# Patient Record
Sex: Female | Born: 1960 | Race: Black or African American | Hispanic: No | Marital: Single | Smoking: Current every day smoker
Health system: Southern US, Community
[De-identification: ages and names within clinical notes are randomized; demographics above are authoritative.]

## PROBLEM LIST (undated history)

## (undated) DIAGNOSIS — F419 Anxiety disorder, unspecified: Secondary | ICD-10-CM

## (undated) DIAGNOSIS — J40 Bronchitis, not specified as acute or chronic: Secondary | ICD-10-CM

## (undated) DIAGNOSIS — K219 Gastro-esophageal reflux disease without esophagitis: Secondary | ICD-10-CM

## (undated) DIAGNOSIS — I10 Essential (primary) hypertension: Secondary | ICD-10-CM

---

## 2014-12-28 ENCOUNTER — Emergency Department
Admission: EM | Admit: 2014-12-28 | Discharge: 2014-12-28 | Disposition: A | Discharge status: Home / Self Care | Payer: Self-pay | Attending: Emergency Medicine | Admitting: Emergency Medicine

## 2014-12-28 ENCOUNTER — Encounter: Payer: Self-pay | Admitting: Emergency Medicine

## 2014-12-28 ENCOUNTER — Emergency Department: Payer: Self-pay

## 2014-12-28 DIAGNOSIS — G43009 Migraine without aura, not intractable, without status migrainosus: Secondary | ICD-10-CM | POA: Insufficient documentation

## 2014-12-28 DIAGNOSIS — F419 Anxiety disorder, unspecified: Secondary | ICD-10-CM | POA: Insufficient documentation

## 2014-12-28 DIAGNOSIS — I10 Essential (primary) hypertension: Secondary | ICD-10-CM | POA: Insufficient documentation

## 2014-12-28 DIAGNOSIS — G43909 Migraine, unspecified, not intractable, without status migrainosus: Secondary | ICD-10-CM

## 2014-12-28 DIAGNOSIS — Z720 Tobacco use: Secondary | ICD-10-CM | POA: Insufficient documentation

## 2014-12-28 DIAGNOSIS — Z79899 Other long term (current) drug therapy: Secondary | ICD-10-CM | POA: Insufficient documentation

## 2014-12-28 HISTORY — DX: Essential (primary) hypertension: I10

## 2014-12-28 HISTORY — DX: Gastro-esophageal reflux disease without esophagitis: K21.9

## 2014-12-28 HISTORY — DX: Bronchitis, not specified as acute or chronic: J40

## 2014-12-28 LAB — CBC
HEMATOCRIT: 40.6 % (ref 35.0–47.0)
HEMOGLOBIN: 13.6 g/dL (ref 12.0–16.0)
MCH: 33.1 pg (ref 26.0–34.0)
MCHC: 33.6 g/dL (ref 32.0–36.0)
MCV: 98.6 fL (ref 80.0–100.0)
Platelets: 164 10*3/uL (ref 150–440)
RBC: 4.12 MIL/uL (ref 3.80–5.20)
RDW: 14.3 % (ref 11.5–14.5)
WBC: 7.1 10*3/uL (ref 3.6–11.0)

## 2014-12-28 LAB — COMPREHENSIVE METABOLIC PANEL
ALT: 66 U/L — ABNORMAL HIGH (ref 14–54)
ANION GAP: 8 (ref 5–15)
AST: 97 U/L — ABNORMAL HIGH (ref 15–41)
Albumin: 3.1 g/dL — ABNORMAL LOW (ref 3.5–5.0)
Alkaline Phosphatase: 196 U/L — ABNORMAL HIGH (ref 38–126)
BUN: 8 mg/dL (ref 6–20)
CO2: 29 mmol/L (ref 22–32)
CREATININE: 0.98 mg/dL (ref 0.44–1.00)
Calcium: 9.2 mg/dL (ref 8.9–10.3)
Chloride: 104 mmol/L (ref 101–111)
GFR calc non Af Amer: >60 mL/min (ref >60)
Glucose, Bld: 113 mg/dL — ABNORMAL HIGH (ref 65–99)
Potassium: 3.6 mmol/L (ref 3.5–5.1)
Sodium: 141 mmol/L (ref 135–145)
TOTAL PROTEIN: 7.9 g/dL (ref 6.5–8.1)
Total Bilirubin: 0.9 mg/dL (ref 0.3–1.2)

## 2014-12-28 MED ORDER — METOCLOPRAMIDE HCL 10 MG PO TABS: 10.0000 mg | ORAL_TABLET | Freq: Three times a day (TID) | ORAL | Status: DC

## 2014-12-28 MED ORDER — DIAZEPAM 5 MG PO TABS
5.0000 mg | ORAL_TABLET | Freq: Once | ORAL | Status: AC
  Administered 2014-12-28: 5 mg via ORAL

## 2014-12-28 MED ORDER — METHYLPREDNISOLONE SODIUM SUCC 125 MG IJ SOLR
125.0000 mg | Freq: Once | INTRAMUSCULAR | Status: AC
  Administered 2014-12-28: 125 mg via INTRAVENOUS

## 2014-12-28 MED ORDER — FAMOTIDINE 20 MG PO TABS
ORAL_TABLET | ORAL | Status: AC
  Administered 2014-12-28: 40 mg via ORAL
  Filled 2014-12-28: qty 2

## 2014-12-28 MED ORDER — METHYLPREDNISOLONE SODIUM SUCC 125 MG IJ SOLR
INTRAMUSCULAR | Status: AC
  Administered 2014-12-28: 125 mg via INTRAVENOUS
  Filled 2014-12-28: qty 2

## 2014-12-28 MED ORDER — METOCLOPRAMIDE HCL 5 MG/ML IJ SOLN
10.0000 mg | Freq: Once | INTRAMUSCULAR | Status: AC
  Administered 2014-12-28: 10 mg via INTRAVENOUS

## 2014-12-28 MED ORDER — FAMOTIDINE 20 MG PO TABS
40.0000 mg | ORAL_TABLET | Freq: Once | ORAL | Status: AC
  Administered 2014-12-28: 40 mg via ORAL

## 2014-12-28 MED ORDER — KETOROLAC TROMETHAMINE 30 MG/ML IJ SOLN
60.0000 mg | Freq: Once | INTRAMUSCULAR | Status: AC
  Administered 2014-12-28: 30 mg via INTRAVENOUS

## 2014-12-28 MED ORDER — DIPHENHYDRAMINE HCL 25 MG PO CAPS: 50.0000 mg | ORAL_CAPSULE | Freq: Four times a day (QID) | ORAL | Status: DC | PRN

## 2014-12-28 MED ORDER — METOCLOPRAMIDE HCL 5 MG/ML IJ SOLN
INTRAMUSCULAR | Status: AC
  Administered 2014-12-28: 10 mg via INTRAVENOUS
  Filled 2014-12-28: qty 2

## 2014-12-28 MED ORDER — DIPHENHYDRAMINE HCL 50 MG/ML IJ SOLN
INTRAMUSCULAR | Status: AC
  Administered 2014-12-28: 50 mg via INTRAVENOUS
  Filled 2014-12-28: qty 1

## 2014-12-28 MED ORDER — KETOROLAC TROMETHAMINE 60 MG/2ML IM SOLN
INTRAMUSCULAR | Status: AC
  Filled 2014-12-28: qty 2

## 2014-12-28 MED ORDER — DIPHENHYDRAMINE HCL 50 MG/ML IJ SOLN
50.0000 mg | Freq: Once | INTRAMUSCULAR | Status: AC
  Administered 2014-12-28: 50 mg via INTRAVENOUS

## 2014-12-28 MED ORDER — DIAZEPAM 5 MG PO TABS
ORAL_TABLET | ORAL | Status: AC
  Administered 2014-12-28: 5 mg via ORAL
  Filled 2014-12-28: qty 1

## 2014-12-28 NOTE — Discharge Instructions (Signed)
Recurrent Migraine Headache A migraine headache is an intense, throbbing pain on one or both sides of your head. Recurrent migraines keep coming back. A migraine can last for 30 minutes to several hours. CAUSES  The exact cause of a migraine headache is not always known. However, a migraine may be caused when nerves in the brain become irritated and release chemicals that cause inflammation. This causes pain. Certain things may also trigger migraines, such as:   Alcohol.  Smoking.  Stress.  Menstruation.  Aged cheeses.  Foods or drinks that contain nitrates, glutamate, aspartame, or tyramine.  Lack of sleep.  Chocolate.  Caffeine.  Hunger.  Physical exertion.  Fatigue.  Medicines used to treat chest pain (nitroglycerine), birth control pills, estrogen, and some blood pressure medicines. SYMPTOMS   Pain on one or both sides of your head.  Pulsating or throbbing pain.  Severe pain that prevents daily activities.  Pain that is aggravated by any physical activity.  Nausea, vomiting, or both.  Dizziness.  Pain with exposure to bright lights, loud noises, or activity.  General sensitivity to bright lights, loud noises, or smells. Before you get a migraine, you may get warning signs that a migraine is coming (aura). An aura may include:  Seeing flashing lights.  Seeing bright spots, halos, or zigzag lines.  Having tunnel vision or blurred vision.  Having feelings of numbness or tingling.  Having trouble talking.  Having muscle weakness. DIAGNOSIS  A recurrent migraine headache is often diagnosed based on:  Symptoms.  Physical examination.  A CT scan or MRI of your head. These imaging tests cannot diagnose migraines but can help rule out other causes of headaches.  TREATMENT  Medicines may be given for pain and nausea. Medicines can also be given to help prevent recurrent migraines. HOME CARE INSTRUCTIONS  Only take over-the-counter or prescription  medicines for pain or discomfort as directed by your health care provider. The use of long-term narcotics is not recommended.  Lie down in a dark, quiet room when you have a migraine.  Keep a journal to find out what may trigger your migraine headaches. For example, write down:  What you eat and drink.  How much sleep you get.  Any change to your diet or medicines.  Limit alcohol consumption.  Quit smoking if you smoke.  Get 7-9 hours of sleep, or as recommended by your health care provider.  Limit stress.  Keep lights dim if bright lights bother you and make your migraines worse. SEEK MEDICAL CARE IF:   You do not get relief from the medicines given to you.  You have a recurrence of pain.  You have a fever. SEEK IMMEDIATE MEDICAL CARE IF:  Your migraine becomes severe.  You have a stiff neck.  You have loss of vision.  You have muscular weakness or loss of muscle control.  You start losing your balance or have trouble walking.  You feel faint or pass out.  You have severe symptoms that are different from your first symptoms. MAKE SURE YOU:   Understand these instructions.  Will watch your condition.  Will get help right away if you are not doing well or get worse. Document Released: 04/25/2001 Document Revised: 12/15/2013 Document Reviewed: 04/07/2013 [facility] Patient Information 2015 [place], [place]. This information is not intended to replace advice given to you by your health care provider. Make sure you discuss any questions you have with your health care provider.  Panic Attacks Panic attacks are sudden, short-livedsurges of  severe anxiety, fear, or discomfort. They may occur for no reason when you are relaxed, when you are anxious, or when you are sleeping. Panic attacks may occur for a number of reasons:   Healthy people occasionally have panic attacks in extreme, life-threatening situations, such as war or natural disasters. Normal anxiety is a  protective mechanism of the body that helps [place] react to danger (fight or flight response).  Panic attacks are often seen with anxiety disorders, such as panic disorder, social anxiety disorder, generalized anxiety disorder, and phobias. Anxiety disorders cause excessive or uncontrollable anxiety. They may interfere with your relationships or other life activities.  Panic attacks are sometimes seen with other mental illnesses, such as depression and posttraumatic stress disorder.  Certain medical conditions, prescription medicines, and drugs of abuse can cause panic attacks. SYMPTOMS  Panic attacks start suddenly, peak within 20 minutes, and are accompanied by four or more of the following symptoms:  Pounding heart or fast heart rate (palpitations).  Sweating.  Trembling or shaking.  Shortness of breath or feeling smothered.  Feeling choked.  Chest pain or discomfort.  Nausea or strange feeling in your stomach.  Dizziness, light-headedness, or feeling like you will faint.  Chills or hot flushes.  Numbness or tingling in your lips or hands and feet.  Feeling that things are not real or feeling that you are not yourself.  Fear of losing control or going crazy.  Fear of dying. Some of these symptoms can mimic serious medical conditions. For example, you may think you are having a heart attack. Although panic attacks can be very scary, they are not life threatening. DIAGNOSIS  Panic attacks are diagnosed through an assessment by your health care provider. Your health care provider will ask questions about your symptoms, such as where and when they occurred. Your health care provider will also ask about your medical history and use of alcohol and drugs, including prescription medicines. Your health care provider may order blood tests or other studies to rule out a serious medical condition. Your health care provider may refer you to a mental health professional for further  evaluation. TREATMENT   Most healthy people who have one or two panic attacks in an extreme, life-threatening situation will not require treatment.  The treatment for panic attacks associated with anxiety disorders or other mental illness typically involves counseling with a mental health professional, medicine, or a combination of both. Your health care provider will help determine what treatment is best for you.  Panic attacks due to physical illness usually go away with treatment of the illness. If prescription medicine is causing panic attacks, talk with your health care provider about stopping the medicine, decreasing the dose, or substituting another medicine.  Panic attacks due to alcohol or drug abuse go away with abstinence. Some adults need professional help in order to stop drinking or using drugs. HOME CARE INSTRUCTIONS   Take all medicines as directed by your health care provider.   Schedule and attend follow-up visits as directed by your health care provider. It is important to keep all your appointments. SEEK MEDICAL CARE IF:  You are not able to take your medicines as prescribed.  Your symptoms do not improve or get worse. SEEK IMMEDIATE MEDICAL CARE IF:   You experience panic attack symptoms that are different than your usual symptoms.  You have serious thoughts about hurting yourself or others.  You are taking medicine for panic attacks and have a serious side effect. MAKE SURE  YOU:  Understand these instructions.  Will watch your condition.  Will get help right away if you are not doing well or get worse. Document Released: 07/31/2005 Document Revised: 08/05/2013 Document Reviewed: 03/14/2013 [facility] Patient Information 2015 [place], [place]. This information is not intended to replace advice given to you by your health care provider. Make sure you discuss any questions you have with your health care provider.

## 2014-12-28 NOTE — ED Notes (Signed)
Pt to ed with c/o headache that started yesterday and shortness of breath that started last night.  Hx of headaches and HTN

## 2014-12-28 NOTE — ED Provider Notes (Addendum)
[facility] Emergency Department Provider Note  ____________________________________________  Time seen: 4:40 PM  I have reviewed the triage vital signs and the nursing notes.   HISTORY  Chief Complaint Shortness of Breath    HPI Caroline Smith is a 54 y.o. female who reports a severe occipital headache on both sides for the past 2-3 days. She also reports history of hypertension and has been compliant with her medications. She has had multiple similar headaches in the past. Including 3 weeks ago and 2 months ago. She has not seen a neurologist for this. She also reports that she is feeling very stressed and anxious because she was moving to [facility], but would rather live in [place] despite pressures from her family.The pain is sharp, throbbing, nonradiating. No aggravating or alleviating factors. She does not have any associated numbness, tingling or weakness or vision changes or nausea, vomiting, fever, chills, chest pain or shortness of breath. She does report photophobia and phonophobia with the headache.    Past Medical History  Diagnosis Date  . Hypertension   . Bronchitis   . GERD (gastroesophageal reflux disease)     There are no active problems to display for this patient.   History reviewed. No pertinent past surgical history.  Current Outpatient Rx  Name  Route  Sig  Dispense  Refill  . diphenhydrAMINE (BENADRYL) 25 mg capsule   Oral   Take 2 capsules (50 mg total) by mouth every 6 (six) hours as needed.   60 capsule   0   . metoCLOPramide (REGLAN) 10 MG tablet   Oral   Take 1 tablet (10 mg total) by mouth 4 (four) times daily -  before meals and at bedtime.   60 tablet   0     Allergies Tramadol  No family history on file.  Social History History  Substance Use Topics  . Smoking status: Current Every Day Smoker  . Smokeless tobacco: Not on file  . Alcohol Use: No    Review of Systems  Constitutional: No  fever or chills. No weight changes Eyes:No blurry vision or double vision.  ENT: No sore throat. Cardiovascular: No chest pain. Respiratory: Chronic shortness of breath due to running out of albuterol and Qvar. Gastrointestinal: Negative for abdominal pain, vomiting and diarrhea.  No BRBPR or melena. Genitourinary: Negative for dysuria, urinary retention, bloody urine, or difficulty urinating. Musculoskeletal: Negative for back pain. No joint swelling or pain. Skin: Negative for rash. Neurological: Negative for headaches, focal weakness or numbness. Psychiatric:No anxiety or depression.   Endocrine:No hot/cold intolerance, changes in energy, or sleep difficulty.  10-point ROS otherwise negative.  ____________________________________________   PHYSICAL EXAM:  VITAL SIGNS: ED Triage Vitals  Enc Vitals Group     BP 12/28/14 1426 173/95 mmHg     Pulse Rate 12/28/14 1426 70     Resp 12/28/14 1426 18     Temp 12/28/14 1426 97.6 F (36.4 C)     Temp Source 12/28/14 1426 Oral     SpO2 12/28/14 1426 98 %     Weight 12/28/14 1426 118 lb (53.524 kg)     Height 12/28/14 1426  (1.702 m)     Head Cir --      Peak Flow --      Pain Score 12/28/14 1427 10     Pain Loc --      Pain Edu? --      Excl. in GC? --  Constitutional: Alert and oriented. Well appearing and in no distress. Anxious and tearful Eyes: No scleral icterus. No conjunctival pallor. PERRL. EOMI ENT   Head: Normocephalic and atraumatic.   Nose: No congestion/rhinnorhea. No septal hematoma   Mouth/Throat: MMM, no pharyngeal erythema. No peritonsillar mass. No uvula shift.   Neck: No stridor. No SubQ emphysema. No meningismus. Hematological/Lymphatic/Immunilogical: No cervical lymphadenopathy. Cardiovascular: RRR. Normal and symmetric distal pulses are present in all extremities. No murmurs, rubs, or gallops. Respiratory: Normal respiratory effort without tachypnea nor retractions. Breath sounds  are clear and equal bilaterally. No wheezes/rales/rhonchi. Gastrointestinal: Soft and nontender. No distention. There is no CVA tenderness.  No rebound, rigidity, or guarding. Genitourinary: deferred Musculoskeletal: Nontender with normal range of motion in all extremities. No joint effusions.  No lower extremity tenderness.  No edema. Neurologic:   Normal speech and language.  CN 2-10 normal. Motor grossly intact. No pronator drift.  Normal gait. No gross focal neurologic deficits are appreciated.  Skin:  Skin is warm, dry and intact. No rash noted.  No petechiae, purpura, or bullae. Psychiatric: Mood and affect are normal. Speech and behavior are normal. Patient exhibits appropriate insight and judgment.  ____________________________________________    LABS (pertinent positives/negatives) (all labs ordered are listed, but only abnormal results are displayed) Labs Reviewed  COMPREHENSIVE METABOLIC PANEL - Abnormal; Notable for the following:    Glucose, Bld 113 (*)    Albumin 3.1 (*)    AST 97 (*)    ALT 66 (*)    Alkaline Phosphatase 196 (*)    All other components within normal limits  CBC   ____________________________________________   EKG    ____________________________________________    RADIOLOGY  Chest x-ray unremarkable  ____________________________________________   PROCEDURES  ____________________________________________   INITIAL IMPRESSION / ASSESSMENT AND PLAN / ED COURSE  Pertinent labs & imaging results that were available during my care of the patient were reviewed by me and considered in my medical decision making (see chart for details).  Patient presents with a headache similar to prior headaches, consistent with migraine headache. No other neurologic signs or symptoms to raise suspicion for stroke or ICH or meningitis. The patient is otherwise well-appearing. She is very anxious and we'll give her some Valium for the anxiety in addition to  other medications for the migraine cocktail. We'll plan to discharge the patient home when she is feeling better to have her follow up with primary care, which she reports she has interrupted.  ____________________________________________   FINAL CLINICAL IMPRESSION(S) / ED DIAGNOSES  Final diagnoses:  Migraine without status migrainosus, not intractable, unspecified migraine type  Anxiety      Sharman Cheek, MD 12/28/14 1718  Sharman Cheek, MD 12/28/14 [phone]

## 2015-01-17 ENCOUNTER — Encounter: Payer: Self-pay | Admitting: Emergency Medicine

## 2015-01-17 ENCOUNTER — Emergency Department
Admission: EM | Admit: 2015-01-17 | Discharge: 2015-01-17 | Disposition: A | Discharge status: Home / Self Care | Payer: Self-pay | Attending: Emergency Medicine | Admitting: Emergency Medicine

## 2015-01-17 DIAGNOSIS — S76219A Strain of adductor muscle, fascia and tendon of unspecified thigh, initial encounter: Secondary | ICD-10-CM

## 2015-01-17 DIAGNOSIS — Y9389 Activity, other specified: Secondary | ICD-10-CM | POA: Insufficient documentation

## 2015-01-17 DIAGNOSIS — Z720 Tobacco use: Secondary | ICD-10-CM | POA: Insufficient documentation

## 2015-01-17 DIAGNOSIS — Y9289 Other specified places as the place of occurrence of the external cause: Secondary | ICD-10-CM | POA: Insufficient documentation

## 2015-01-17 DIAGNOSIS — S39011A Strain of muscle, fascia and tendon of abdomen, initial encounter: Secondary | ICD-10-CM | POA: Insufficient documentation

## 2015-01-17 DIAGNOSIS — I10 Essential (primary) hypertension: Secondary | ICD-10-CM | POA: Insufficient documentation

## 2015-01-17 DIAGNOSIS — X58XXXA Exposure to other specified factors, initial encounter: Secondary | ICD-10-CM | POA: Insufficient documentation

## 2015-01-17 DIAGNOSIS — Z79899 Other long term (current) drug therapy: Secondary | ICD-10-CM | POA: Insufficient documentation

## 2015-01-17 DIAGNOSIS — Y990 Civilian activity done for income or pay: Secondary | ICD-10-CM | POA: Insufficient documentation

## 2015-01-17 HISTORY — DX: Anxiety disorder, unspecified: F41.9

## 2015-01-17 MED ORDER — NAPROXEN 250 MG PO TABS: 250.0000 mg | ORAL_TABLET | Freq: Two times a day (BID) | ORAL | Status: DC

## 2015-01-17 MED ORDER — NAPROXEN 500 MG PO TABS
500.0000 mg | ORAL_TABLET | Freq: Once | ORAL | Status: AC
  Administered 2015-01-17: 500 mg via ORAL

## 2015-01-17 MED ORDER — NAPROXEN 500 MG PO TABS
ORAL_TABLET | ORAL | Status: AC
  Administered 2015-01-17: 500 mg via ORAL
  Filled 2015-01-17: qty 1

## 2015-01-17 NOTE — ED Provider Notes (Signed)
[facility] Emergency Department Provider Note  ____________________________________________  Time seen: 5:30 AM  I have reviewed the triage vital signs and the nursing notes.   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 Since then she's had pain with movement of the left hip including flexion and abduction and abduction. She is able to bear weight on the leg. No other injuries. No fall or head injury. No chest pain shortness of breath belly pain dysuria frequency urgency saddle anesthesia or bowel or bladder incontinence or retention.     Past Medical History  Diagnosis Date  . Hypertension   . Bronchitis   . GERD (gastroesophageal reflux disease)   . Anxiety     There are no active problems to display for this patient.   No past surgical history on file.  Current Outpatient Rx  Name  Route  Sig  Dispense  Refill  . amLODipine (NORVASC) 10 MG tablet   Oral   Take 10 mg by mouth daily.         Marland Kitchen atenolol (TENORMIN) 50 MG tablet   Oral   Take 50 mg by mouth daily.         . diphenhydrAMINE (BENADRYL) 25 mg capsule   Oral   Take 2 capsules (50 mg total) by mouth every 6 (six) hours as needed.   60 capsule   0   . FLUoxetine (PROZAC) 20 MG capsule   Oral   Take 20 mg by mouth daily.         Marland Kitchen lisinopril (PRINIVIL,ZESTRIL) 40 MG tablet   Oral   Take 40 mg by mouth daily.         . metoCLOPramide (REGLAN) 10 MG tablet   Oral   Take 1 tablet (10 mg total) by mouth 4 (four) times daily -  before meals and at bedtime.   60 tablet   0   . naproxen (NAPROSYN) 250 MG tablet   Oral   Take 1 tablet (250 mg total) by mouth 2 (two) times daily with a meal.   40 tablet   0   . ondansetron (ZOFRAN) 4 MG tablet   Oral   Take 4 mg by mouth  every 8 (eight) hours as needed for nausea or vomiting.         . traZODone (DESYREL) 50 MG tablet   Oral   Take 50 mg by mouth at bedtime as needed for sleep.           Allergies Tramadol  No family history on file.  Social History History  Substance Use Topics  . Smoking status: Current Every Day Smoker  . Smokeless tobacco: Not on file  . Alcohol Use: No    Review of Systems  Constitutional: No fever or chills. No weight changes Eyes:No blurry vision or double vision.  ENT: No sore throat. Cardiovascular: No chest pain. Respiratory: No dyspnea or cough. Gastrointestinal: Negative for abdominal pain, vomiting and diarrhea.  No BRBPR or melena. Genitourinary: Negative for dysuria, urinary retention, bloody urine, or difficulty urinating. Musculoskeletal: Left groin pain as above. Skin: Negative for rash. Neurological: Negative for headaches, focal weakness or numbness. Psychiatric:No anxiety or depression.   Endocrine:No hot/cold intolerance, changes in energy, or sleep  difficulty.  10-point ROS otherwise negative.  ____________________________________________   PHYSICAL EXAM:  VITAL SIGNS: ED Triage Vitals  Enc Vitals Group     BP 01/17/15 0042 149/83 mmHg     Pulse Rate 01/17/15 0042 88     Resp 01/17/15 0042 18     Temp 01/17/15 0042 98.7 F (37.1 C)     Temp Source 01/17/15 0042 Oral     SpO2 01/17/15 0042 98 %     Weight 01/17/15 0042 128 lb (58.06 kg)     Height 01/17/15 0042 5\' 7"  (1.702 m)     Head Cir --      Peak Flow --      Pain Score 01/17/15 0043 10     Pain Loc --      Pain Edu? --      Excl. in GC? --      Constitutional: Alert and oriented. Well appearing and in no distress. Sleeping comfortably, easily arousable Eyes: No scleral icterus. No conjunctival pallor. PERRL. EOMI ENT   Head: Normocephalic and atraumatic.   Nose: No congestion/rhinnorhea. No septal hematoma   Mouth/Throat: MMM, no pharyngeal erythema. No  peritonsillar mass. No uvula shift.   Neck: No stridor. No SubQ emphysema. No meningismus. Hematological/Lymphatic/Immunilogical: No cervical lymphadenopathy. Cardiovascular: RRR. Normal and symmetric distal pulses are present in all extremities. No murmurs, rubs, or gallops. Respiratory: Normal respiratory effort without tachypnea nor retractions. Breath sounds are clear and equal bilaterally. No wheezes/rales/rhonchi. Gastrointestinal: Soft and nontender. No distention. There is no CVA tenderness.  No rebound, rigidity, or guarding. Genitourinary: deferred Musculoskeletal: Pain elicited and reproduced with palpation of the left groin at the area of the gracilis, as well as with left hip flexion and external rotation.  Neurologic:   Normal speech and language.  CN 2-10 normal. Motor grossly intact. No pronator drift.  Normal gait. No gross focal neurologic deficits are appreciated.  Skin:  Skin is warm, dry and intact. No rash noted.  No petechiae, purpura, or bullae. Psychiatric: Mood and affect are normal. Speech and behavior are normal. Patient exhibits appropriate insight and judgment.  ____________________________________________    LABS (pertinent positives/negatives) (all labs ordered are listed, but only abnormal results are displayed) Labs Reviewed - No data to display ____________________________________________   EKG    ____________________________________________    RADIOLOGY    ____________________________________________   PROCEDURES  ____________________________________________   INITIAL IMPRESSION / ASSESSMENT AND PLAN / ED COURSE  Pertinent labs & imaging results that were available during my care of the patient were reviewed by me and considered in my medical decision making (see chart for details).  Patient presents with musculoskeletal pain from a left groin strain. No evidence of fracture or dislocation or other injuries or illness. Counseled  patient on range of motion exercises, gentle stretching, light exercise, and NSAIDs for pain.  ____________________________________________   FINAL CLINICAL IMPRESSION(S) / ED DIAGNOSES  Final diagnoses:  Groin strain, initial encounter      Sharman Cheek, MD 01/17/15 [phone]

## 2015-01-17 NOTE — ED Notes (Addendum)
Pt c/o left groin pain after doing cleaning with her daughter in [place] on Friday; has also been doing some heavy lifting; hot bath helps pain feel better; since this afternoon pain has increased; no swelling to area or left leg; pt says during triage that she has been depressed; history of anxiety; did have suicidal thoughts about a week ago but says she believes in God and would never hurt herself; says she would like some information upon discharge for an outpatient place she can make an appointment with; pt is not suicidal at this time; charge nurse dawn tulloch is aware

## 2015-01-17 NOTE — ED Notes (Signed)
Pt reports she was moving a large garbage bag of clothes on Friday when she felt something pull in her left groin. Pain has become increasingly worse since. Pt reports she has soaked in a tub of warm water and that helped the pain a little for a short time but pain worse now. Pt states unable to walk due to the pain. CMS intact.

## 2015-01-17 NOTE — Discharge Instructions (Signed)
Groin Strain °A groin strain (also called a groin pull) is an injury to the muscles or tendon on the upper inner part of the thigh. These muscles are called the adductor muscles or groin muscles. They are responsible for moving the leg across the body. A muscle strain occurs when a muscle is overstretched and some muscle fibers are torn. A groin strain can range from mild to severe depending on how many muscle fibers are affected and whether the muscle fibers are partially or completely torn.  °Groin strains usually occur during exercise or participation in sports. The injury often happens when a sudden, violent force is placed on a muscle, stretching the muscle too far. A strain is more likely to occur when your muscles are not warmed up or if you are not properly conditioned. Depending on the severity of the groin strain, recovery time may vary from a few weeks to several weeks. Severe injuries often require 4-6 weeks for recovery. In these cases, complete healing can take 4-5 months.  °CAUSES  °· Stretching the groin muscles too far or too suddenly, often during side-to-side motion with an abrupt change in direction. °· Putting repeated stress on the groin muscles over a long period of time. °· Performing vigorous activity without properly stretching the groin muscles beforehand. °SYMPTOMS  °· Pain and tenderness in the groin area. This begins as sharp pain and persists as a dull ache. °· Popping or snapping feeling when the injury occurs (for severe strains). °· Swelling or bruising. °· Muscle spasms. °· Weakness in the leg. °· Stiffness in the groin area with decreased ability to move the affected muscles. °DIAGNOSIS  °Your caregiver will perform a physical exam to diagnose a groin strain. You will be asked about your symptoms and how the injury occurred. X-rays are sometimes needed to rule out a broken bone or cartilage problems. Your caregiver may order a CT scan or MRI if a complete muscle tear is  suspected. °TREATMENT  °A groin strain will often heal on its own. Your caregiver may prescribe medicines to help manage pain and swelling (anti-inflammatory medicine). You may be told to use crutches for the first few days to minimize your pain. °HOME CARE INSTRUCTIONS  °· Rest. Do not use the strained muscle if it causes pain. °· Put ice on the injured area. °¨ Put ice in a plastic bag. °¨ Place a towel between your skin and the bag. °¨ Leave the ice on for 15-20 minutes, every 2-3 hours. Do this for the first 2 days after the injury.  °· Only take over-the-counter or prescription medicines as directed by your caregiver. °· Wrap the injured area with an elastic bandage as directed by your caregiver. °· Keep the injured leg raised (elevated). °· Walk, stretch, and perform range-of-motion exercises to improve blood flow to the injured area. Only perform these activities if you can do so without any pain. °To prevent muscle strains: °· Warm up before exercise. °· Develop proper conditioning and strength in the groin muscles. °SEEK IMMEDIATE MEDICAL CARE IF:  °· You have increased pain or swelling in the affected area.   °· Your symptoms are not improving or are getting worse. °MAKE SURE YOU:  °· Understand these instructions. °· Will watch your condition. °· Will get help right away if you are not doing well or get worse. °Document Released: 03/28/2004 Document Revised: 07/17/2012 Document Reviewed: 04/03/2012 °[employer]® Patient Information ©2015 [employer]. This information is not intended to replace advice given to you   by your health care provider. Make sure you discuss any questions you have with your health care provider. ° °

## 2020-12-23 ENCOUNTER — Other Ambulatory Visit: Payer: Self-pay

## 2020-12-23 ENCOUNTER — Emergency Department
Admission: EM | Admit: 2020-12-23 | Discharge: 2020-12-23 | Disposition: A | Discharge status: Home / Self Care | Payer: Medicaid Other | Attending: Emergency Medicine | Admitting: Emergency Medicine

## 2020-12-23 ENCOUNTER — Emergency Department: Payer: Medicaid Other

## 2020-12-23 DIAGNOSIS — Z79899 Other long term (current) drug therapy: Secondary | ICD-10-CM | POA: Insufficient documentation

## 2020-12-23 DIAGNOSIS — R079 Chest pain, unspecified: Secondary | ICD-10-CM | POA: Insufficient documentation

## 2020-12-23 DIAGNOSIS — F17200 Nicotine dependence, unspecified, uncomplicated: Secondary | ICD-10-CM | POA: Diagnosis not present

## 2020-12-23 DIAGNOSIS — R1013 Epigastric pain: Secondary | ICD-10-CM | POA: Diagnosis not present

## 2020-12-23 DIAGNOSIS — R1010 Upper abdominal pain, unspecified: Secondary | ICD-10-CM | POA: Diagnosis present

## 2020-12-23 DIAGNOSIS — R1115 Cyclical vomiting syndrome unrelated to migraine: Secondary | ICD-10-CM | POA: Diagnosis not present

## 2020-12-23 DIAGNOSIS — I10 Essential (primary) hypertension: Secondary | ICD-10-CM | POA: Insufficient documentation

## 2020-12-23 LAB — BASIC METABOLIC PANEL
Anion gap: 11 (ref 5–15)
BUN: 22 mg/dL — ABNORMAL HIGH (ref 6–20)
CO2: 23 mmol/L (ref 22–32)
Calcium: 9.5 mg/dL (ref 8.9–10.3)
Chloride: 107 mmol/L (ref 98–111)
Creatinine, Ser: 1.41 mg/dL — ABNORMAL HIGH (ref 0.44–1.00)
GFR, Estimated: 43 mL/min — ABNORMAL LOW (ref >60)
Glucose, Bld: 116 mg/dL — ABNORMAL HIGH (ref 70–99)
Potassium: 3.9 mmol/L (ref 3.5–5.1)
Sodium: 141 mmol/L (ref 135–145)

## 2020-12-23 LAB — HEPATIC FUNCTION PANEL
ALT: 15 U/L (ref 0–44)
AST: 26 U/L (ref 15–41)
Albumin: 3.5 g/dL (ref 3.5–5.0)
Alkaline Phosphatase: 112 U/L (ref 38–126)
Bilirubin, Direct: <0.1 mg/dL (ref 0.0–0.2)
Total Bilirubin: 0.4 mg/dL (ref 0.3–1.2)
Total Protein: 8.1 g/dL (ref 6.5–8.1)

## 2020-12-23 LAB — TROPONIN I (HIGH SENSITIVITY)
Troponin I (High Sensitivity): 16 ng/L (ref <18)
Troponin I (High Sensitivity): 18 ng/L — ABNORMAL HIGH (ref <18)

## 2020-12-23 LAB — CBC
HCT: 37.4 % (ref 36.0–46.0)
Hemoglobin: 11.4 g/dL — ABNORMAL LOW (ref 12.0–15.0)
MCH: 27.1 pg (ref 26.0–34.0)
MCHC: 30.5 g/dL (ref 30.0–36.0)
MCV: 89 fL (ref 80.0–100.0)
Platelets: 299 10*3/uL (ref 150–400)
RBC: 4.2 MIL/uL (ref 3.87–5.11)
RDW: 28.1 % — ABNORMAL HIGH (ref 11.5–15.5)
WBC: 8.9 10*3/uL (ref 4.0–10.5)
nRBC: 0 % (ref 0.0–0.2)

## 2020-12-23 LAB — MAGNESIUM: Magnesium: 2 mg/dL (ref 1.7–2.4)

## 2020-12-23 LAB — LIPASE, BLOOD: Lipase: 97 U/L — ABNORMAL HIGH (ref 11–51)

## 2020-12-23 MED ORDER — DEXTROSE 5 % AND 0.9 % NACL IV BOLUS
1000.0000 mL | Freq: Once | INTRAVENOUS | Status: AC
  Administered 2020-12-23: 500 mL via INTRAVENOUS
  Filled 2020-12-23: qty 1000

## 2020-12-23 MED ORDER — ALUMINUM-MAGNESIUM-SIMETHICONE 200-200-20 MG/5ML PO SUSP: 30.0000 mL | Freq: Three times a day (TID) | ORAL | 0 refills | Status: AC

## 2020-12-23 MED ORDER — FAMOTIDINE 20 MG PO TABS: 20.0000 mg | ORAL_TABLET | Freq: Two times a day (BID) | ORAL | 0 refills | Status: DC

## 2020-12-23 MED ORDER — HALOPERIDOL LACTATE 5 MG/ML IJ SOLN
2.5000 mg | Freq: Once | INTRAMUSCULAR | Status: AC
  Administered 2020-12-23: 2.5 mg via INTRAVENOUS
  Filled 2020-12-23: qty 1

## 2020-12-23 MED ORDER — KETOROLAC TROMETHAMINE 30 MG/ML IJ SOLN
15.0000 mg | INTRAMUSCULAR | Status: AC
  Administered 2020-12-23: 15 mg via INTRAVENOUS
  Filled 2020-12-23: qty 1

## 2020-12-23 MED ORDER — AMLODIPINE BESYLATE 5 MG PO TABS
10.0000 mg | ORAL_TABLET | Freq: Once | ORAL | Status: AC
  Administered 2020-12-23: 10 mg via ORAL
  Filled 2020-12-23: qty 2

## 2020-12-23 MED ORDER — METOCLOPRAMIDE HCL 10 MG PO TABS: 10.0000 mg | ORAL_TABLET | Freq: Three times a day (TID) | ORAL | 0 refills | Status: DC

## 2020-12-23 MED ORDER — PANTOPRAZOLE SODIUM 40 MG IV SOLR
40.0000 mg | Freq: Once | INTRAVENOUS | Status: AC
  Administered 2020-12-23: 40 mg via INTRAVENOUS
  Filled 2020-12-23: qty 40

## 2020-12-23 NOTE — ED Notes (Signed)
Pt given graham crackers, peanut butter, and shasta

## 2020-12-23 NOTE — ED Provider Notes (Signed)
[facility] Emergency Department Provider Note  ____________________________________________  Time seen: Approximately 2:31 PM  I have reviewed the triage vital signs and the nursing notes.   HISTORY  Chief Complaint Chest Pain    HPI Caroline Smith is a 60 y.o. female with a history of hypertension and GERD cannabinoid hyperemesis syndrome and cyclic vomiting syndrome, PAD, NSTEMI who comes the ED complaining of upper abdominal pain for the past week, constant, waxing and waning, worse with eating, associated nausea and vomiting.  No diarrhea or constipation.  Nonradiating.  Moderate to severe in intensity.  Feels similar to pain she has had in the past.  Smokes marijuana regularly, last 3 days ago.      Past Medical History:  Diagnosis Date  . Anxiety   . Bronchitis   . GERD (gastroesophageal reflux disease)   . Hypertension      There are no problems to display for this patient.    History reviewed. No pertinent surgical history.   Prior to Admission medications   Medication Sig Start Date End Date Taking? Authorizing Provider  aluminum-magnesium hydroxide-simethicone (MAALOX) 200-200-20 MG/5ML SUSP Take 30 mLs by mouth 4 (four) times daily -  before meals and at bedtime. 12/23/20  Yes Sharman Cheek, MD  famotidine (PEPCID) 20 MG tablet Take 1 tablet (20 mg total) by mouth 2 (two) times daily. 12/23/20  Yes Sharman Cheek, MD  amLODipine (NORVASC) 10 MG tablet Take 10 mg by mouth daily.    [provider]  atenolol (TENORMIN) 50 MG tablet Take 50 mg by mouth daily.    [provider]  diphenhydrAMINE (BENADRYL) 25 mg capsule Take 2 capsules (50 mg total) by mouth every 6 (six) hours as needed. 12/28/14   Sharman Cheek, MD  FLUoxetine (PROZAC) 20 MG capsule Take 20 mg by mouth daily.    [provider]  lisinopril (PRINIVIL,ZESTRIL) 40 MG tablet Take 40 mg by mouth daily.    [provider]   metoCLOPramide (REGLAN) 10 MG tablet Take 1 tablet (10 mg total) by mouth 4 (four) times daily -  before meals and at bedtime. 12/23/20   Sharman Cheek, MD  ondansetron (ZOFRAN) 4 MG tablet Take 4 mg by mouth every 8 (eight) hours as needed for nausea or vomiting.    [provider]  traZODone (DESYREL) 50 MG tablet Take 50 mg by mouth at bedtime as needed for sleep.    [provider]     Allergies Tramadol   No family history on file.  Social History Social History   Tobacco Use  . Smoking status: Current Every Day Smoker  . Smokeless tobacco: Never Used  Substance Use Topics  . Alcohol use: No  . Drug use: No    Review of Systems  Constitutional:   No fever or chills.  ENT:   No sore throat. No rhinorrhea. Cardiovascular:   No chest pain or syncope. Respiratory:   No dyspnea or cough. Gastrointestinal:   Positive as above for upper abdominal pain and vomiting Musculoskeletal:   Negative for focal pain or swelling All other systems reviewed and are negative except as documented above in ROS and HPI.  ____________________________________________   PHYSICAL EXAM:  VITAL SIGNS: ED Triage Vitals  Enc Vitals Group     BP 12/23/20 1126 (!) 210/89     Pulse Rate 12/23/20 1126 70     Resp 12/23/20 1126 20     Temp 12/23/20 1132 98.6 F (37 C)  Temp Source 12/23/20 1132 Oral     SpO2 12/23/20 1126 98 %     Weight --      Height --      Head Circumference --      Peak Flow --      Pain Score --      Pain Loc --      Pain Edu? --      Excl. in GC? --     Vital signs reviewed, nursing assessments reviewed.   Constitutional:   Alert and oriented. Non-toxic appearance. Eyes:   Conjunctivae are normal. EOMI. PERRL. ENT      Head:   Normocephalic and atraumatic.      Nose:   Wearing a mask.      Mouth/Throat:   Wearing a mask.      Neck:   No meningismus. Full ROM. Hematological/Lymphatic/Immunilogical:   No cervical  lymphadenopathy. Cardiovascular:   RRR. Symmetric bilateral radial and DP pulses.  No murmurs. Cap refill less than 2 seconds. Respiratory:   Normal respiratory effort without tachypnea/retractions. Breath sounds are clear and equal bilaterally. No wheezes/rales/rhonchi. Gastrointestinal:   Soft with epigastric and left upper quadrant tenderness. Non distended. There is no CVA tenderness.  No rebound, rigidity, or guarding. Genitourinary:   deferred Musculoskeletal:   Normal range of motion in all extremities. No joint effusions.  No lower extremity tenderness.  No edema. Neurologic:   Normal speech and language.  Motor grossly intact. No acute focal neurologic deficits are appreciated.  Skin:    Skin is warm, dry and intact. No rash noted.  No petechiae, purpura, or bullae.  ____________________________________________    LABS (pertinent positives/negatives) (all labs ordered are listed, but only abnormal results are displayed) Labs Reviewed  BASIC METABOLIC PANEL - Abnormal; Notable for the following components:      Result Value   Glucose, Bld 116 (*)    BUN 22 (*)    Creatinine, Ser 1.41 (*)    GFR, Estimated 43 (*)    All other components within normal limits  CBC - Abnormal; Notable for the following components:   Hemoglobin 11.4 (*)    RDW 28.1 (*)    All other components within normal limits  LIPASE, BLOOD - Abnormal; Notable for the following components:   Lipase 97 (*)    All other components within normal limits  TROPONIN I (HIGH SENSITIVITY) - Abnormal; Notable for the following components:   Troponin I (High Sensitivity) 18 (*)    All other components within normal limits  MAGNESIUM  HEPATIC FUNCTION PANEL  TROPONIN I (HIGH SENSITIVITY)   ____________________________________________   EKG  Interpreted by me Normal sinus rhythm rate of 80, normal axis and intervals.  Normal QRS.  Normal ST segments.  Diffuse T wave inversions in inferior and anterolateral  leads.  EKG appears unchanged compared to previously described EKGs in the [facility] system describing signs of inferior and anterolateral ischemia.  ____________________________________________    RADIOLOGY  DG Chest 2 View  Result Date: 12/23/2020 CLINICAL DATA:  Chest pain EXAM: CHEST - 2 VIEW COMPARISON:  12/28/2014 FINDINGS: Normal heart size and stable mediastinal contours. Symmetric nipple shadows there is no edema, consolidation, effusion, or pneumothorax. Mild scoliosis. Artifact from EKG leads. IMPRESSION: Stable from 2016.  No acute finding. Electronically Signed   By: Marnee Spring M.D.   On: 12/23/2020 11:55    ____________________________________________   PROCEDURES Procedures  ____________________________________________  DIFFERENTIAL DIAGNOSIS   Dehydration, pancreatitis, gastritis, cyclic vomiting/cannabinoid  hyperemesis  CLINICAL IMPRESSION / ASSESSMENT AND PLAN / ED COURSE  Medications ordered in the ED: Medications  amLODipine (NORVASC) tablet 10 mg (has no administration in time range)  dextrose 5 % and 0.9% NaCl 5-0.9 % bolus 1,000 mL (500 mLs Intravenous New Bag/Given 12/23/20 1300)  ketorolac (TORADOL) 30 MG/ML injection 15 mg (15 mg Intravenous Given 12/23/20 1232)  pantoprazole (PROTONIX) injection 40 mg (40 mg Intravenous Given 12/23/20 1233)  haloperidol lactate (HALDOL) injection 2.5 mg (2.5 mg Intravenous Given 12/23/20 1233)    Pertinent labs & imaging results that were available during my care of the patient were reviewed by me and considered in my medical decision making (see chart for details).  Caroline Smith was evaluated in Emergency Department on 12/23/2020 for the symptoms described in the history of present illness. She was evaluated in the context of the global COVID-19 pandemic, which necessitated consideration that the patient might be at risk for infection with the SARS-CoV-2 virus that causes COVID-19. Institutional protocols and  algorithms that pertain to the evaluation of patients at risk for COVID-19 are in a state of rapid change based on information released by regulatory bodies including the CDC and federal and state organizations. These policies and algorithms were followed during the patient's care in the ED.   Patient presents with abdominal pain and vomiting, part of an established pattern of symptoms for her.  She is nontoxic, exam is otherwise reassuring.  Labs are unremarkable.  Serial troponins are negative.  EKG no identifiable acute changes.  Doubt ACS PE dissection AAA pneumothorax pericarditis or serious intra-abdominal process such as diverticulitis, bowel obstruction, bowel perforation, intraabdominal abscess.  Patient given antiemetics, IV fluids, Toradol for pain.  She is sleeping comfortably.  Will p.o. trial, anticipate discharge home.  Her hypertension is currently uncontrolled, due to medication noncompliance in the setting of her illness.  Once symptoms are improved, she can resume her medications.   ----------------------------------------- 4:09 PM on 12/23/2020 -----------------------------------------  Remains comfortable.  We will give her dose of amlodipine for today, stable for discharge.     ____________________________________________   FINAL CLINICAL IMPRESSION(S) / ED DIAGNOSES    Final diagnoses:  Cyclic vomiting syndrome  Epigastric pain     ED Discharge Orders         Ordered    metoCLOPramide (REGLAN) 10 MG tablet  3 times daily before meals & bedtime        12/23/20 1431    famotidine (PEPCID) 20 MG tablet  2 times daily        12/23/20 1431    aluminum-magnesium hydroxide-simethicone (MAALOX) 200-200-20 MG/5ML SUSP  3 times daily before meals & bedtime        12/23/20 1431          Portions of this note were generated with dragon dictation software. Dictation errors may occur despite best attempts at proofreading.   Sharman Cheek, MD 12/23/20  [phone]

## 2020-12-23 NOTE — ED Triage Notes (Signed)
Pt comes into the ED via EMS from home with c/o chest pain for the past week, having N/V that started today.Marland Kitchen  #[id number]  4mg ZOfran [id number] 194/101 100%RA

## 2020-12-23 NOTE — Discharge Instructions (Signed)
Your lab tests and x-ray were all okay today.  Stick to a bland diet, and keep taking nausea and stomach acid medicine as prescribed to control your symptoms.  It will help to abstain from alcohol and marijuana as well for the next week at least.

## 2020-12-26 ENCOUNTER — Other Ambulatory Visit: Payer: Self-pay

## 2020-12-26 ENCOUNTER — Emergency Department: Payer: Medicaid Other

## 2020-12-26 ENCOUNTER — Encounter: Payer: Self-pay | Admitting: Intensive Care

## 2020-12-26 ENCOUNTER — Emergency Department
Admission: EM | Admit: 2020-12-26 | Discharge: 2020-12-26 | Disposition: A | Discharge status: Home / Self Care | Payer: Medicaid Other | Attending: Emergency Medicine | Admitting: Emergency Medicine

## 2020-12-26 DIAGNOSIS — I509 Heart failure, unspecified: Secondary | ICD-10-CM | POA: Insufficient documentation

## 2020-12-26 DIAGNOSIS — R0602 Shortness of breath: Secondary | ICD-10-CM | POA: Insufficient documentation

## 2020-12-26 DIAGNOSIS — Z20822 Contact with and (suspected) exposure to covid-19: Secondary | ICD-10-CM | POA: Diagnosis not present

## 2020-12-26 DIAGNOSIS — R079 Chest pain, unspecified: Secondary | ICD-10-CM | POA: Insufficient documentation

## 2020-12-26 DIAGNOSIS — N189 Chronic kidney disease, unspecified: Secondary | ICD-10-CM | POA: Diagnosis not present

## 2020-12-26 DIAGNOSIS — K2920 Alcoholic gastritis without bleeding: Secondary | ICD-10-CM | POA: Diagnosis not present

## 2020-12-26 DIAGNOSIS — F1290 Cannabis use, unspecified, uncomplicated: Secondary | ICD-10-CM | POA: Insufficient documentation

## 2020-12-26 DIAGNOSIS — Z79899 Other long term (current) drug therapy: Secondary | ICD-10-CM | POA: Diagnosis not present

## 2020-12-26 DIAGNOSIS — F17200 Nicotine dependence, unspecified, uncomplicated: Secondary | ICD-10-CM | POA: Insufficient documentation

## 2020-12-26 DIAGNOSIS — I130 Hypertensive heart and chronic kidney disease with heart failure and stage 1 through stage 4 chronic kidney disease, or unspecified chronic kidney disease: Secondary | ICD-10-CM | POA: Insufficient documentation

## 2020-12-26 DIAGNOSIS — R1010 Upper abdominal pain, unspecified: Secondary | ICD-10-CM | POA: Diagnosis present

## 2020-12-26 LAB — CBC WITH DIFFERENTIAL/PLATELET
Abs Immature Granulocytes: 0.01 10*3/uL (ref 0.00–0.07)
Basophils Absolute: 0 10*3/uL (ref 0.0–0.1)
Basophils Relative: 0 %
Eosinophils Absolute: 0.1 10*3/uL (ref 0.0–0.5)
Eosinophils Relative: 2 %
HCT: 34.2 % — ABNORMAL LOW (ref 36.0–46.0)
Hemoglobin: 10.6 g/dL — ABNORMAL LOW (ref 12.0–15.0)
Immature Granulocytes: 0 %
Lymphocytes Relative: 26 %
Lymphs Abs: 1.4 10*3/uL (ref 0.7–4.0)
MCH: 27.6 pg (ref 26.0–34.0)
MCHC: 31 g/dL (ref 30.0–36.0)
MCV: 89.1 fL (ref 80.0–100.0)
Monocytes Absolute: 0.4 10*3/uL (ref 0.1–1.0)
Monocytes Relative: 7 %
Neutro Abs: 3.5 10*3/uL (ref 1.7–7.7)
Neutrophils Relative %: 65 %
Platelets: 211 10*3/uL (ref 150–400)
RBC: 3.84 MIL/uL — ABNORMAL LOW (ref 3.87–5.11)
RDW: 27.5 % — ABNORMAL HIGH (ref 11.5–15.5)
Smear Review: NORMAL
WBC: 5.4 10*3/uL (ref 4.0–10.5)
nRBC: 0 % (ref 0.0–0.2)

## 2020-12-26 LAB — URINALYSIS, COMPLETE (UACMP) WITH MICROSCOPIC
Bilirubin Urine: NEGATIVE
Glucose, UA: NEGATIVE mg/dL
Hgb urine dipstick: NEGATIVE
Ketones, ur: NEGATIVE mg/dL
Leukocytes,Ua: NEGATIVE
Nitrite: NEGATIVE
Protein, ur: 100 mg/dL — AB
Specific Gravity, Urine: 1.012 (ref 1.005–1.030)
pH: 6 (ref 5.0–8.0)

## 2020-12-26 LAB — URINE DRUG SCREEN, QUALITATIVE (ARMC ONLY)
Amphetamines, Ur Screen: NOT DETECTED
Barbiturates, Ur Screen: NOT DETECTED
Benzodiazepine, Ur Scrn: NOT DETECTED
Cannabinoid 50 Ng, Ur ~~LOC~~: POSITIVE — AB
Cocaine Metabolite,Ur ~~LOC~~: NOT DETECTED
MDMA (Ecstasy)Ur Screen: NOT DETECTED
Methadone Scn, Ur: NOT DETECTED
Opiate, Ur Screen: NOT DETECTED
Phencyclidine (PCP) Ur S: NOT DETECTED
Tricyclic, Ur Screen: NOT DETECTED

## 2020-12-26 LAB — RESP PANEL BY RT-PCR (FLU A&B, COVID) ARPGX2
Influenza A by PCR: NEGATIVE
Influenza B by PCR: NEGATIVE
SARS Coronavirus 2 by RT PCR: NEGATIVE

## 2020-12-26 LAB — COMPREHENSIVE METABOLIC PANEL
ALT: 19 U/L (ref 0–44)
AST: 40 U/L (ref 15–41)
Albumin: 3.8 g/dL (ref 3.5–5.0)
Alkaline Phosphatase: 105 U/L (ref 38–126)
Anion gap: 11 (ref 5–15)
BUN: 23 mg/dL — ABNORMAL HIGH (ref 6–20)
CO2: 23 mmol/L (ref 22–32)
Calcium: 9.1 mg/dL (ref 8.9–10.3)
Chloride: 104 mmol/L (ref 98–111)
Creatinine, Ser: 1.77 mg/dL — ABNORMAL HIGH (ref 0.44–1.00)
GFR, Estimated: 33 mL/min — ABNORMAL LOW (ref >60)
Glucose, Bld: 118 mg/dL — ABNORMAL HIGH (ref 70–99)
Potassium: 5.1 mmol/L (ref 3.5–5.1)
Sodium: 138 mmol/L (ref 135–145)
Total Bilirubin: 0.9 mg/dL (ref 0.3–1.2)
Total Protein: 8.1 g/dL (ref 6.5–8.1)

## 2020-12-26 LAB — TROPONIN I (HIGH SENSITIVITY): Troponin I (High Sensitivity): 16 ng/L (ref <18)

## 2020-12-26 LAB — MAGNESIUM: Magnesium: 2 mg/dL (ref 1.7–2.4)

## 2020-12-26 LAB — ETHANOL: Alcohol, Ethyl (B): <10 mg/dL (ref <10)

## 2020-12-26 LAB — LIPASE, BLOOD: Lipase: 79 U/L — ABNORMAL HIGH (ref 11–51)

## 2020-12-26 LAB — D-DIMER, QUANTITATIVE: D-Dimer, Quant: 0.69 ug/mL-FEU — ABNORMAL HIGH (ref 0.00–0.50)

## 2020-12-26 MED ORDER — TECHNETIUM TO 99M ALBUMIN AGGREGATED
4.0000 | Freq: Once | INTRAVENOUS | Status: AC | PRN
  Administered 2020-12-26: 3.6 via INTRAVENOUS

## 2020-12-26 MED ORDER — ONDANSETRON 4 MG PO TBDP
4.0000 mg | ORAL_TABLET | Freq: Once | ORAL | Status: AC
  Administered 2020-12-26: 4 mg via ORAL
  Filled 2020-12-26: qty 1

## 2020-12-26 MED ORDER — ALUM & MAG HYDROXIDE-SIMETH 200-200-20 MG/5ML PO SUSP
30.0000 mL | Freq: Once | ORAL | Status: AC
  Administered 2020-12-26: 30 mL via ORAL
  Filled 2020-12-26: qty 30

## 2020-12-26 MED ORDER — PANTOPRAZOLE SODIUM 40 MG IV SOLR
40.0000 mg | Freq: Once | INTRAVENOUS | Status: AC
  Administered 2020-12-26: 40 mg via INTRAVENOUS
  Filled 2020-12-26: qty 40

## 2020-12-26 MED ORDER — ACETAMINOPHEN 500 MG PO TABS
1000.0000 mg | ORAL_TABLET | Freq: Once | ORAL | Status: AC
  Administered 2020-12-26: 1000 mg via ORAL
  Filled 2020-12-26: qty 2

## 2020-12-26 MED ORDER — FENTANYL CITRATE (PF) 100 MCG/2ML IJ SOLN
50.0000 ug | Freq: Once | INTRAMUSCULAR | Status: AC
  Administered 2020-12-26: 50 ug via INTRAVENOUS
  Filled 2020-12-26: qty 2

## 2020-12-26 NOTE — ED Triage Notes (Signed)
Patient c/o right chest pain with nausea. Has been seen multiple times at different hospitals for same. Patient reports recent pneumonia diagnosis

## 2020-12-26 NOTE — ED Notes (Signed)
Patient transported to [facility] 

## 2020-12-26 NOTE — Discharge Instructions (Addendum)
Take the medications recently prescribed to you on last ED visit.  Return to ER if develop worsening pain, fevers or any other concerns.  Continue to not use alcohol and avoid marijuana use.

## 2020-12-26 NOTE — ED Notes (Signed)
Patient made aware by MD Fuller Plan she cannot eat at this time. Can eat if scans come back normal

## 2020-12-26 NOTE — ED Provider Notes (Signed)
[facility] Emergency Department Provider Note  ____________________________________________   Event Date/Time   First MD Initiated Contact with Patient 12/26/20 0930     (approximate)  I have reviewed the triage vital signs and the nursing notes.   HISTORY  Chief Complaint Chest Pain    HPI Caroline Smith is a 60 y.o. female with hepatitis C, CHF, prior substance abuse, CKD who comes in for multiple symptoms.  Patient reports having continued upper abdominal pain nausea, feeling like she is going to vomit but has not vomited.  She did not pick up any of the prescriptions recently prescribed to her on 5/12.  She also now reports worsening shortness of breath and pain on the right side of her chest when she takes a deep breath.  This is intermittent, has not been take anything to help it.  Denies any leg swelling or history of PE.  Patient states that she has not drink alcohol in a few weeks.  Reports last marijuana use was on Friday.  Denies cocaine use.  To note patient already had cholecystectomy  On review of records patient had admission back in April 2022 secondary to acute hypoxic respiratory failure.  Patient was found to have echocardiogram showing severe LV dysfunction.  Also has underlying COPD, anemia requiring blood transfusion, community-acquired pneumonia.  To note patient underwent left heart cath on 5/5 which showed normal coronary arteries.  Patient was seen by our provider here on 5/12 and discharged with treatment for gastritis.      Past Medical History:  Diagnosis Date  . Anxiety   . Bronchitis   . GERD (gastroesophageal reflux disease)   . Hypertension     There are no problems to display for this patient.   No past surgical history on file.  Prior to Admission medications   Medication Sig Start Date End Date Taking? Authorizing Provider  aluminum-magnesium hydroxide-simethicone (MAALOX) 200-200-20 MG/5ML SUSP Take 30 mLs  by mouth 4 (four) times daily -  before meals and at bedtime. 12/23/20   Sharman Cheek, MD  amLODipine (NORVASC) 10 MG tablet Take 10 mg by mouth daily.    [provider]  atenolol (TENORMIN) 50 MG tablet Take 50 mg by mouth daily.    [provider]  diphenhydrAMINE (BENADRYL) 25 mg capsule Take 2 capsules (50 mg total) by mouth every 6 (six) hours as needed. 12/28/14   Sharman Cheek, MD  famotidine (PEPCID) 20 MG tablet Take 1 tablet (20 mg total) by mouth 2 (two) times daily. 12/23/20   Sharman Cheek, MD  FLUoxetine (PROZAC) 20 MG capsule Take 20 mg by mouth daily.    [provider]  lisinopril (PRINIVIL,ZESTRIL) 40 MG tablet Take 40 mg by mouth daily.    [provider]  metoCLOPramide (REGLAN) 10 MG tablet Take 1 tablet (10 mg total) by mouth 4 (four) times daily -  before meals and at bedtime. 12/23/20   Sharman Cheek, MD  ondansetron (ZOFRAN) 4 MG tablet Take 4 mg by mouth every 8 (eight) hours as needed for nausea or vomiting.    [provider]  traZODone (DESYREL) 50 MG tablet Take 50 mg by mouth at bedtime as needed for sleep.    [provider]    Allergies Tramadol  No family history on file.  Social History Social History   Tobacco Use  . Smoking status: Current Every Day Smoker  . Smokeless tobacco: Never Used  Substance Use Topics  . Alcohol  use: No  . Drug use: No      Review of Systems Constitutional: No fever/chills Eyes: No visual changes. ENT: No sore throat. Cardiovascular: Positive chest pain Respiratory: Positive shortness of breath, pain with brief Gastrointestinal: Positive abdominal pain, nausea, vomiting Genitourinary: Negative for dysuria. Musculoskeletal: Negative for back pain. Skin: Negative for rash. Neurological: Negative for headaches, focal weakness or numbness. All other ROS negative ____________________________________________   PHYSICAL EXAM:  VITAL SIGNS: Blood  pressure (!) 145/86, pulse 70, temperature (!) 97.4 F (36.3 C), temperature source Oral, resp. rate 19, height 5' 7.5" (1.715 m), weight 50.8 kg, SpO2 100 %.   Constitutional: Alert and oriented. Well appearing and in no acute distress. Eyes: Conjunctivae are normal. EOMI. Head: Atraumatic. Nose: No congestion/rhinnorhea. Mouth/Throat: Mucous membranes are moist.   Neck: No stridor. Trachea Midline. FROM Cardiovascular: Normal rate, regular rhythm. Grossly normal heart sounds.  Good peripheral circulation. Respiratory: Normal respiratory effort.  No retractions. Lungs CTAB. Gastrointestinal: Patient reporting pain in the upper abdomen without any rebound or guarding. No distention. No abdominal bruits.  Musculoskeletal: No lower extremity tenderness nor edema.  No joint effusions. Neurologic:  Normal speech and language. No gross focal neurologic deficits are appreciated.  Skin:  Skin is warm, dry and intact. No rash noted. Psychiatric: Mood and affect are normal. Speech and behavior are normal. GU: Deferred   ____________________________________________   LABS (all labs ordered are listed, but only abnormal results are displayed)  Labs Reviewed  RESP PANEL BY RT-PCR (FLU A&B, COVID) ARPGX2  CBC WITH DIFFERENTIAL/PLATELET  COMPREHENSIVE METABOLIC PANEL  LIPASE, BLOOD  D-DIMER, QUANTITATIVE  ETHANOL  URINE DRUG SCREEN, QUALITATIVE ([facility] ONLY)  URINALYSIS, COMPLETE (UACMP) WITH MICROSCOPIC  MAGNESIUM  TROPONIN I (HIGH SENSITIVITY)   ____________________________________________   ED ECG REPORT I, Concha Se, the attending physician, personally viewed and interpreted this ECG.  Sinus rate of 66 with T wave inversions in lead II, III, aVF, V2 through V6 that are similar from prior, no ST elevation, QTC prolonged 527 ____________________________________________  RADIOLOGY Vela Prose, personally viewed and evaluated these images (plain radiographs) as part of my medical  decision making, as well as reviewing the written report by the radiologist.  ED MD interpretation:  No PNA on xray   Official radiology report(s): CT ABDOMEN PELVIS WO CONTRAST  Result Date: 12/26/2020 CLINICAL DATA:  Abdominal distention.  RIGHT chest pain with nausea. EXAM: CT ABDOMEN AND PELVIS WITHOUT CONTRAST TECHNIQUE: Multidetector CT imaging of the abdomen and pelvis was performed following the standard protocol without IV contrast. COMPARISON:  None. FINDINGS: Lower chest: No acute abnormality. Hepatobiliary: Cholecystectomy.  Liver is homogeneous. Pancreas: Unremarkable. No pancreatic ductal dilatation or surrounding inflammatory changes. Spleen: Normal in size without focal abnormality. Adrenals/Urinary Tract: Adrenal glands are normal. Intrarenal calculi measure 2-3 millimeters in both kidneys, RIGHT greater than LEFT no renal mass. Ureters are unremarkable. The bladder and visualized portion of the urethra are normal. Stomach/Bowel: Stomach and small bowel loops are normal in appearance. Note is made of small duodenal diverticulum. There are scattered colonic diverticula. Moderate stool burden. The appendix is not well seen and may be surgically absent. Vascular/Lymphatic: There is dense atherosclerotic calcification of the abdominal aorta, not associated with aneurysm. Reproductive: Uterus is present.  No adnexal mass. Other: Small amount of free pelvic fluid noted in the cul-de-sac. The abdominal wall is unremarkable. Musculoskeletal: No acute or significant osseous findings. IMPRESSION: 1. Small amount of free pelvic fluid noted in the cul-de-sac, of indeterminate  origin. 2. Cholecystectomy. 3. Nephrolithiasis without evidence for obstructive uropathy. 4. Small duodenal diverticulum.  Moderate stool burden. 5.  Aortic atherosclerosis.  (ICD10-I70.0) Electronically Signed   By: Norva Pavlov M.D.   On: 12/26/2020 13:05   DG Chest 2 View  Result Date: 12/26/2020 CLINICAL DATA:  Shortness  of breath.  RIGHT chest pain with nausea. EXAM: CHEST - 2 VIEW COMPARISON:  12/23/2020 FINDINGS: Heart size is normal. Lungs are free of focal consolidations and pleural effusions. No pulmonary edema. IMPRESSION: No evidence for acute cardiopulmonary abnormality. Electronically Signed   By: Norva Pavlov M.D.   On: 12/26/2020 10:28   NM Pulmonary Perfusion  Result Date: 12/26/2020 CLINICAL DATA:  60 year old who complains of chest pain and abdominal pain for which the patient has had 2 emergency department visits this week. EXAM: NUCLEAR MEDICINE PERFUSION LUNG SCAN TECHNIQUE: Perfusion images were obtained in multiple projections after intravenous injection of radiopharmaceutical. Ventilation scans intentionally deferred if perfusion scan and chest x-ray adequate for interpretation during COVID-19. RADIOPHARMACEUTICALS:  3.6 mCi Tc-31m MAA IV. COMPARISON:  No prior nuclear medicine perfusion imaging. Chest x-ray earlier same day is correlated. FINDINGS: Relatively homogeneous perfusion throughout both lungs. No segmental or subsegmental perfusion defects. Photopenia related to the cardiomediastinal silhouette. IMPRESSION: Normal pulmonary perfusion. Electronically Signed   By: Hulan Saas M.D.   On: 12/26/2020 14:04    ____________________________________________   PROCEDURES  Procedure(s) performed (including Critical Care):  .1-3 Lead EKG Interpretation Performed by: Concha Se, MD Authorized by: Concha Se, MD     Interpretation: normal     ECG rate:  60s    ECG rate assessment: normal     Rhythm: sinus rhythm     Ectopy: none     Conduction: normal       ____________________________________________   INITIAL IMPRESSION / ASSESSMENT AND PLAN / ED COURSE   Caroline Smith was evaluated in Emergency Department on 12/26/2020 for the symptoms described in the history of present illness. She was evaluated in the context of the global COVID-19 pandemic, which  necessitated consideration that the patient might be at risk for infection with the SARS-CoV-2 virus that causes COVID-19. Institutional protocols and algorithms that pertain to the evaluation of patients at risk for COVID-19 are in a state of rapid change based on information released by regulatory bodies including the CDC and federal and state organizations. These policies and algorithms were followed during the patient's care in the ED.    Most Likely DDx:  Patient comes in with multiple symptoms but mostly being the upper abdominal pain as well as pain with taking a deep breath.  Patient does have recent hospitalization and unable to [facility] out due to age therefore we will get D-dimer, chest x-ray, COVID swab to further evaluate.  For upper abdominal pain will get labs to evaluate for pancreatitis, retained stone although patient is already having gallbladder removed.   DDx that was also considered d/t potential to cause harm, but was found less likely based on history and physical (as detailed above): -PNA (no fevers, cough but CXR to evaluate) -PNX (reassured with equal b/l breath sounds, CXR to evaluate) -Symptomatic anemia (will get H&H) -Aortic Dissection as no tearing pain and no radiation to the mid back, pulses equal -Pericarditis no rub on exam, EKG changes or hx to suggest dx -Tamponade (no notable SOB, tachycardic, hypotensive) -Esophageal rupture (no h/o diffuse vomitting/no crepitus)  D-dimer is elevated therefore we will proceed with VQ scan.  We will also get a CT abdomen due to consistently elevated lipase to make sure no signs of complication given her repeat evaluation.   This patient was evaluated during a time of global shortage of iodinated contrast media. Based on guidance from the [employer] of Radiology, best practices, and local institutional approaches an alternative path for evaluating and managing the patient may have been employed in order to provide optimal care  during this shortage. The current situation has been discussed with the patient.   2:48 PM CT scan reassuring.  VQ scan negative.  Reevaluated patient she is tolerating p.o.  Patient would like to go home at this time.  Discussed patient that she needs to pick up the medications previously prescribed to her.  She said that she would.  Encourage continue alcohol cessation.  I discussed the provisional nature of ED diagnosis, the treatment so far, the ongoing plan of care, follow up appointments and return precautions with the patient and any family or support people present. They expressed understanding and agreed with the plan, discharged home.    ____________________________________________   FINAL CLINICAL IMPRESSION(S) / ED DIAGNOSES   Final diagnoses:  Chest pain, unspecified type  Acute alcoholic gastritis without hemorrhage     MEDICATIONS GIVEN DURING THIS VISIT:  Medications  pantoprazole (PROTONIX) injection 40 mg (40 mg Intravenous Given 12/26/20 1048)  alum & mag hydroxide-simeth (MAALOX/MYLANTA) 200-200-20 MG/5ML suspension 30 mL (30 mLs Oral Given 12/26/20 1051)  acetaminophen (TYLENOL) tablet 1,000 mg (1,000 mg Oral Given 12/26/20 1050)  ondansetron (ZOFRAN-ODT) disintegrating tablet 4 mg (4 mg Oral Given 12/26/20 1047)  fentaNYL (SUBLIMAZE) injection 50 mcg (50 mcg Intravenous Given 12/26/20 1206)  technetium albumin aggregated (MAA) injection solution 4 millicurie (3.6 millicuries Intravenous Contrast Given 12/26/20 1300)     ED Discharge Orders    None       Note:  This document was prepared using Dragon voice recognition software and may include unintentional dictation errors.   Concha Se, MD 12/26/20 1450

## 2020-12-26 NOTE — ED Notes (Signed)
Patient given sandwich tray and sprite. 

## 2021-01-19 ENCOUNTER — Inpatient Hospital Stay
Admission: EM | Admit: 2021-01-19 | Discharge: 2021-01-23 | DRG: 683 | Disposition: A | Discharge status: Home / Self Care | Payer: Medicaid Other | Attending: Internal Medicine | Admitting: Internal Medicine

## 2021-01-19 ENCOUNTER — Emergency Department: Payer: Medicaid Other

## 2021-01-19 ENCOUNTER — Encounter: Payer: Self-pay | Admitting: Emergency Medicine

## 2021-01-19 ENCOUNTER — Other Ambulatory Visit: Payer: Self-pay

## 2021-01-19 DIAGNOSIS — I10 Essential (primary) hypertension: Secondary | ICD-10-CM

## 2021-01-19 DIAGNOSIS — E860 Dehydration: Secondary | ICD-10-CM

## 2021-01-19 DIAGNOSIS — R55 Syncope and collapse: Secondary | ICD-10-CM | POA: Diagnosis present

## 2021-01-19 DIAGNOSIS — G8929 Other chronic pain: Secondary | ICD-10-CM | POA: Diagnosis present

## 2021-01-19 DIAGNOSIS — Z886 Allergy status to analgesic agent status: Secondary | ICD-10-CM

## 2021-01-19 DIAGNOSIS — Z20822 Contact with and (suspected) exposure to covid-19: Secondary | ICD-10-CM | POA: Diagnosis present

## 2021-01-19 DIAGNOSIS — I700 Atherosclerosis of aorta: Secondary | ICD-10-CM | POA: Diagnosis present

## 2021-01-19 DIAGNOSIS — E785 Hyperlipidemia, unspecified: Secondary | ICD-10-CM | POA: Diagnosis present

## 2021-01-19 DIAGNOSIS — N1832 Chronic kidney disease, stage 3b: Secondary | ICD-10-CM | POA: Diagnosis present

## 2021-01-19 DIAGNOSIS — F17210 Nicotine dependence, cigarettes, uncomplicated: Secondary | ICD-10-CM | POA: Diagnosis present

## 2021-01-19 DIAGNOSIS — K529 Noninfective gastroenteritis and colitis, unspecified: Secondary | ICD-10-CM | POA: Diagnosis present

## 2021-01-19 DIAGNOSIS — N183 Chronic kidney disease, stage 3 unspecified: Secondary | ICD-10-CM | POA: Diagnosis present

## 2021-01-19 DIAGNOSIS — M549 Dorsalgia, unspecified: Secondary | ICD-10-CM | POA: Diagnosis present

## 2021-01-19 DIAGNOSIS — F32A Depression, unspecified: Secondary | ICD-10-CM | POA: Diagnosis present

## 2021-01-19 DIAGNOSIS — N179 Acute kidney failure, unspecified: Secondary | ICD-10-CM | POA: Diagnosis not present

## 2021-01-19 DIAGNOSIS — Z8249 Family history of ischemic heart disease and other diseases of the circulatory system: Secondary | ICD-10-CM

## 2021-01-19 DIAGNOSIS — R531 Weakness: Secondary | ICD-10-CM

## 2021-01-19 DIAGNOSIS — K7460 Unspecified cirrhosis of liver: Secondary | ICD-10-CM | POA: Diagnosis present

## 2021-01-19 DIAGNOSIS — K219 Gastro-esophageal reflux disease without esophagitis: Secondary | ICD-10-CM | POA: Diagnosis present

## 2021-01-19 DIAGNOSIS — F17200 Nicotine dependence, unspecified, uncomplicated: Secondary | ICD-10-CM | POA: Diagnosis present

## 2021-01-19 DIAGNOSIS — B1920 Unspecified viral hepatitis C without hepatic coma: Secondary | ICD-10-CM | POA: Diagnosis present

## 2021-01-19 DIAGNOSIS — Z79899 Other long term (current) drug therapy: Secondary | ICD-10-CM

## 2021-01-19 DIAGNOSIS — I428 Other cardiomyopathies: Secondary | ICD-10-CM

## 2021-01-19 DIAGNOSIS — A084 Viral intestinal infection, unspecified: Secondary | ICD-10-CM | POA: Diagnosis present

## 2021-01-19 DIAGNOSIS — I129 Hypertensive chronic kidney disease with stage 1 through stage 4 chronic kidney disease, or unspecified chronic kidney disease: Secondary | ICD-10-CM | POA: Diagnosis present

## 2021-01-19 LAB — CBC WITH DIFFERENTIAL/PLATELET
Abs Immature Granulocytes: 0.01 10*3/uL (ref 0.00–0.07)
Basophils Absolute: 0 10*3/uL (ref 0.0–0.1)
Basophils Relative: 0 %
Eosinophils Absolute: 0.1 10*3/uL (ref 0.0–0.5)
Eosinophils Relative: 2 %
HCT: 34.8 % — ABNORMAL LOW (ref 36.0–46.0)
Hemoglobin: 11.3 g/dL — ABNORMAL LOW (ref 12.0–15.0)
Immature Granulocytes: 0 %
Lymphocytes Relative: 32 %
Lymphs Abs: 1.9 10*3/uL (ref 0.7–4.0)
MCH: 29.1 pg (ref 26.0–34.0)
MCHC: 32.5 g/dL (ref 30.0–36.0)
MCV: 89.7 fL (ref 80.0–100.0)
Monocytes Absolute: 0.4 10*3/uL (ref 0.1–1.0)
Monocytes Relative: 7 %
Neutro Abs: 3.5 10*3/uL (ref 1.7–7.7)
Neutrophils Relative %: 59 %
Platelets: 253 10*3/uL (ref 150–400)
RBC: 3.88 MIL/uL (ref 3.87–5.11)
RDW: 22.9 % — ABNORMAL HIGH (ref 11.5–15.5)
Smear Review: NORMAL
WBC: 6 10*3/uL (ref 4.0–10.5)
nRBC: 0 % (ref 0.0–0.2)

## 2021-01-19 LAB — RESP PANEL BY RT-PCR (FLU A&B, COVID) ARPGX2
Influenza A by PCR: NEGATIVE
Influenza B by PCR: NEGATIVE
SARS Coronavirus 2 by RT PCR: NEGATIVE

## 2021-01-19 LAB — BASIC METABOLIC PANEL
Anion gap: 5 (ref 5–15)
BUN: 28 mg/dL — ABNORMAL HIGH (ref 6–20)
CO2: 25 mmol/L (ref 22–32)
Calcium: 8.5 mg/dL — ABNORMAL LOW (ref 8.9–10.3)
Chloride: 105 mmol/L (ref 98–111)
Creatinine, Ser: 2.36 mg/dL — ABNORMAL HIGH (ref 0.44–1.00)
GFR, Estimated: 23 mL/min — ABNORMAL LOW (ref >60)
Glucose, Bld: 104 mg/dL — ABNORMAL HIGH (ref 70–99)
Potassium: 4.9 mmol/L (ref 3.5–5.1)
Sodium: 135 mmol/L (ref 135–145)

## 2021-01-19 LAB — COMPREHENSIVE METABOLIC PANEL
ALT: 11 U/L (ref 0–44)
AST: 24 U/L (ref 15–41)
Albumin: 3.1 g/dL — ABNORMAL LOW (ref 3.5–5.0)
Alkaline Phosphatase: 94 U/L (ref 38–126)
Anion gap: 11 (ref 5–15)
BUN: 30 mg/dL — ABNORMAL HIGH (ref 6–20)
CO2: 24 mmol/L (ref 22–32)
Calcium: 8.5 mg/dL — ABNORMAL LOW (ref 8.9–10.3)
Chloride: 103 mmol/L (ref 98–111)
Creatinine, Ser: 2.59 mg/dL — ABNORMAL HIGH (ref 0.44–1.00)
GFR, Estimated: 21 mL/min — ABNORMAL LOW (ref >60)
Glucose, Bld: 103 mg/dL — ABNORMAL HIGH (ref 70–99)
Potassium: 4.6 mmol/L (ref 3.5–5.1)
Sodium: 138 mmol/L (ref 135–145)
Total Bilirubin: 0.5 mg/dL (ref 0.3–1.2)
Total Protein: 6.7 g/dL (ref 6.5–8.1)

## 2021-01-19 LAB — URINALYSIS, COMPLETE (UACMP) WITH MICROSCOPIC
Bilirubin Urine: NEGATIVE
Glucose, UA: 50 mg/dL — AB
Hgb urine dipstick: NEGATIVE
Ketones, ur: NEGATIVE mg/dL
Nitrite: NEGATIVE
Protein, ur: 100 mg/dL — AB
Specific Gravity, Urine: 1.012 (ref 1.005–1.030)
pH: 5 (ref 5.0–8.0)

## 2021-01-19 LAB — MAGNESIUM: Magnesium: 3.2 mg/dL — ABNORMAL HIGH (ref 1.7–2.4)

## 2021-01-19 LAB — TROPONIN I (HIGH SENSITIVITY): Troponin I (High Sensitivity): 11 ng/L (ref <18)

## 2021-01-19 MED ORDER — TRAZODONE HCL 50 MG PO TABS
50.0000 mg | ORAL_TABLET | Freq: Every evening | ORAL | Status: DC | PRN
  Administered 2021-01-19 – 2021-01-21 (×2): 50 mg via ORAL
  Filled 2021-01-19 (×2): qty 1

## 2021-01-19 MED ORDER — ATENOLOL 50 MG PO TABS
50.0000 mg | ORAL_TABLET | Freq: Every day | ORAL | Status: DC
  Administered 2021-01-20: 50 mg via ORAL
  Filled 2021-01-19 (×2): qty 1
  Filled 2021-01-19: qty 2

## 2021-01-19 MED ORDER — MORPHINE SULFATE (PF) 4 MG/ML IV SOLN
4.0000 mg | Freq: Once | INTRAVENOUS | Status: AC
  Administered 2021-01-19: 4 mg via INTRAVENOUS
  Filled 2021-01-19: qty 1

## 2021-01-19 MED ORDER — SODIUM CHLORIDE 0.9 % IV SOLN: INTRAVENOUS | Status: AC

## 2021-01-19 MED ORDER — NICOTINE 7 MG/24HR TD PT24
7.0000 mg | MEDICATED_PATCH | Freq: Every day | TRANSDERMAL | Status: DC
  Administered 2021-01-19 – 2021-01-22 (×4): 7 mg via TRANSDERMAL
  Filled 2021-01-19 (×5): qty 1

## 2021-01-19 MED ORDER — METOCLOPRAMIDE HCL 10 MG PO TABS
10.0000 mg | ORAL_TABLET | Freq: Three times a day (TID) | ORAL | Status: DC
  Administered 2021-01-19 – 2021-01-23 (×14): 10 mg via ORAL
  Filled 2021-01-19 (×15): qty 1

## 2021-01-19 MED ORDER — LACTATED RINGERS IV BOLUS: 1000.0000 mL | Freq: Once | INTRAVENOUS | Status: DC

## 2021-01-19 MED ORDER — ONDANSETRON HCL 4 MG/2ML IJ SOLN
4.0000 mg | Freq: Once | INTRAMUSCULAR | Status: AC
  Administered 2021-01-19: 4 mg via INTRAVENOUS
  Filled 2021-01-19: qty 2

## 2021-01-19 MED ORDER — ONDANSETRON HCL 4 MG PO TABS: 4.0000 mg | ORAL_TABLET | Freq: Four times a day (QID) | ORAL | Status: DC | PRN

## 2021-01-19 MED ORDER — ONDANSETRON HCL 4 MG/2ML IJ SOLN
4.0000 mg | Freq: Four times a day (QID) | INTRAMUSCULAR | Status: DC | PRN
  Administered 2021-01-21: 4 mg via INTRAVENOUS
  Filled 2021-01-19: qty 2

## 2021-01-19 MED ORDER — FAMOTIDINE 20 MG PO TABS
20.0000 mg | ORAL_TABLET | Freq: Every day | ORAL | Status: DC
  Administered 2021-01-19 – 2021-01-22 (×4): 20 mg via ORAL
  Filled 2021-01-19 (×4): qty 1

## 2021-01-19 MED ORDER — ACETAMINOPHEN 650 MG RE SUPP: 650.0000 mg | Freq: Four times a day (QID) | RECTAL | Status: DC | PRN

## 2021-01-19 MED ORDER — ACETAMINOPHEN 325 MG PO TABS
650.0000 mg | ORAL_TABLET | Freq: Four times a day (QID) | ORAL | Status: DC | PRN
  Administered 2021-01-19 – 2021-01-23 (×4): 650 mg via ORAL
  Filled 2021-01-19 (×4): qty 2

## 2021-01-19 MED ORDER — LACTATED RINGERS IV BOLUS
1000.0000 mL | Freq: Once | INTRAVENOUS | Status: AC
  Administered 2021-01-19: 1000 mL via INTRAVENOUS

## 2021-01-19 MED ORDER — ENOXAPARIN SODIUM 30 MG/0.3ML IJ SOSY
30.0000 mg | PREFILLED_SYRINGE | INTRAMUSCULAR | Status: DC
  Administered 2021-01-19 – 2021-01-21 (×3): 30 mg via SUBCUTANEOUS
  Filled 2021-01-19 (×4): qty 0.3

## 2021-01-19 MED ORDER — FLUOXETINE HCL 20 MG PO CAPS
20.0000 mg | ORAL_CAPSULE | Freq: Every day | ORAL | Status: DC
  Administered 2021-01-20 – 2021-01-23 (×4): 20 mg via ORAL
  Filled 2021-01-19 (×6): qty 1

## 2021-01-19 NOTE — H&P (Addendum)
History and Physical    Caroline Smith TDS:[id number] DOB: [birth year] DOA: 01/19/2021  PCP: Patient, No Pcp Per (Inactive)   Patient coming from: Home  I have personally briefly reviewed patient's old medical records in [facility]  Chief Complaint: Weakness  HPI: Caroline Smith is a 60 y.o. female with medical history significant for  Hepatitis C with liver cirrhosis, cardiomyopathy with LVEF less than 25%, stage III chronic kidney disease, nicotine dependence and prior substance abuse who presents to the ER for evaluation of weakness and feeling like she is going to pass out. She states that she contracted a virus and has had nausea and diarrhea for several days.  On the day of admission she felt very weak like she was going to pass out and so she presented to the ER for further evaluation.  She also has diffuse abdominal pain. She denies having any shortness of breath, no chest pain, no fever, no palpitations, no diaphoresis, no leg swelling, no cough, no chills, no headache, no urinary symptoms, no focal deficits or blurred vision. Labs show sodium 135, potassium 4.9, chloride 105, bicarb 25, glucose 104, BUN 28, creatinine 2.59 compared to baseline of 1.7, calcium 8.5, magnesium 2.2, alkaline phosphatase 95, albumin 3.9, AST 24, ALT 11, total protein 6.7, white count 6.0, hemoglobin 11.3, hematocrit 34.8, MCV 89.7, RDW 22.9, platelet count 253 Respiratory viral panel is pending Urine analysis is sterile CT scan of abdomen and pelvis shows no acute findings in the abdomen or pelvis. Specifically, no findings to explain the patient's history of abdominal pain and recurrent diarrhea.Nodular liver contour compatible with cirrhosis.2-3 mm nonobstructing stone upper pole right kidney. Interval resolution of the trace free fluid seen in the pelvis previously. Aortic Atherosclerosis  Twelve-lead EKG reviewed by me shows normal sinus rhythm  ED Course: Patient is a 60 year old  African-American female with multiple medical problems who presents to the emergency room for evaluation of weakness and near syncopal episode.  She has had diarrhea and poor oral intake for several days. Labs show worsening of her renal function from baseline, at baseline she has a serum creatinine of 1.7 on admission today it is 2.59. She will be referred to observation status for further evaluation.  Review of Systems: As per HPI otherwise all other systems reviewed and negative.    Past Medical History:  Diagnosis Date  . Anxiety   . Bronchitis   . GERD (gastroesophageal reflux disease)   . Hypertension     History reviewed. No pertinent surgical history.   reports that she has been smoking cigarettes. She has never used smokeless tobacco. She reports that she does not drink alcohol and does not use drugs.  Allergies  Allergen Reactions  . Tramadol Other (See Comments)    Reaction:  Gave pt anxiety     Family History  Problem Relation Age of Onset  . Hypertension Mother       Prior to Admission medications   Medication Sig Start Date End Date Taking? Authorizing Provider  aluminum-magnesium hydroxide-simethicone (MAALOX) 200-200-20 MG/5ML SUSP Take 30 mLs by mouth 4 (four) times daily -  before meals and at bedtime. 12/23/20   Sharman Cheek, MD  amLODipine (NORVASC) 10 MG tablet Take 10 mg by mouth daily.    [provider]  atenolol (TENORMIN) 50 MG tablet Take 50 mg by mouth daily.    [provider]  diphenhydrAMINE (BENADRYL) 25 mg capsule Take 2 capsules (50 mg total) by mouth every 6 (  six) hours as needed. 12/28/14   Sharman Cheek, MD  famotidine (PEPCID) 20 MG tablet Take 1 tablet (20 mg total) by mouth 2 (two) times daily. 12/23/20   Sharman Cheek, MD  FLUoxetine (PROZAC) 20 MG capsule Take 20 mg by mouth daily.    [provider]  lisinopril (PRINIVIL,ZESTRIL) 40 MG tablet Take 40 mg by mouth daily.    [provider]   metoCLOPramide (REGLAN) 10 MG tablet Take 1 tablet (10 mg total) by mouth 4 (four) times daily -  before meals and at bedtime. 12/23/20   Sharman Cheek, MD  ondansetron (ZOFRAN) 4 MG tablet Take 4 mg by mouth every 8 (eight) hours as needed for nausea or vomiting.    [provider]  traZODone (DESYREL) 50 MG tablet Take 50 mg by mouth at bedtime as needed for sleep.    [provider]    Physical Exam: Vitals:   01/19/21 1300 01/19/21 1430 01/19/21 1515 01/19/21 1530  BP: (!) 145/76 136/78    Pulse: 60 62 65   Resp: 12 14 (!) 23 16  Temp:      TempSrc:      SpO2: 97% 94% 100%   Weight:      Height:         Vitals:   01/19/21 1300 01/19/21 1430 01/19/21 1515 01/19/21 1530  BP: (!) 145/76 136/78    Pulse: 60 62 65   Resp: 12 14 (!) 23 16  Temp:      TempSrc:      SpO2: 97% 94% 100%   Weight:      Height:          Constitutional: Alert and oriented x 3. Not in any apparent distress. Thin and frail HEENT:      Head: Normocephalic and atraumatic.         Eyes: PERLA, EOMI, Conjunctivae pallor. Sclera is non-icteric.       Mouth/Throat: Mucous membranes are dry.       Neck: Supple with no signs of meningismus. Cardiovascular: Regular rate and rhythm. No murmurs, gallops, or rubs. 2+ symmetrical distal pulses are present . No JVD. No LE edema Respiratory: Respiratory effort normal .Lungs sounds clear bilaterally. No wheezes, crackles, or rhonchi.  Gastrointestinal: Soft, diffusely tender, and non distended with positive bowel sounds.  Genitourinary: No CVA tenderness. Musculoskeletal: Nontender with normal range of motion in all extremities. No cyanosis, or erythema of extremities. Neurologic:  Face is symmetric. Moving all extremities. No gross focal neurologic deficits  Skin: Skin is warm, dry.  No rash or ulcers Psychiatric: Mood and affect are normal   Labs on Admission: I have personally reviewed following labs and imaging studies  CBC: Recent  Labs  Lab 01/19/21 1101  WBC 6.0  NEUTROABS 3.5  HGB 11.3*  HCT 34.8*  MCV 89.7  PLT 253   Basic Metabolic Panel: Recent Labs  Lab 01/19/21 1101 01/19/21 1454  NA 138 135  K 4.6 4.9  CL 103 105  CO2 24 25  GLUCOSE 103* 104*  BUN 30* 28*  CREATININE 2.59* 2.36*  CALCIUM 8.5* 8.5*  MG 3.2*  --    GFR: Estimated Creatinine Clearance: 20.8 mL/min (A) (by C-G formula based on SCr of 2.36 mg/dL (H)). Liver Function Tests: Recent Labs  Lab 01/19/21 1101  AST 24  ALT 11  ALKPHOS 94  BILITOT 0.5  PROT 6.7  ALBUMIN 3.1*   No results for input(s): LIPASE, AMYLASE in the last 168  hours. No results for input(s): AMMONIA in the last 168 hours. Coagulation Profile: No results for input(s): INR, PROTIME in the last 168 hours. Cardiac Enzymes: No results for input(s): CKTOTAL, CKMB, CKMBINDEX, TROPONINI in the last 168 hours. BNP (last 3 results) No results for input(s): PROBNP in the last 8760 hours. HbA1C: No results for input(s): HGBA1C in the last 72 hours. CBG: No results for input(s): GLUCAP in the last 168 hours. Lipid Profile: No results for input(s): CHOL, HDL, LDLCALC, TRIG, CHOLHDL, LDLDIRECT in the last 72 hours. Thyroid Function Tests: No results for input(s): TSH, T4TOTAL, FREET4, T3FREE, THYROIDAB in the last 72 hours. Anemia Panel: No results for input(s): VITAMINB12, FOLATE, FERRITIN, TIBC, IRON, RETICCTPCT in the last 72 hours. Urine analysis:    Component Value Date/Time   COLORURINE YELLOW (A) 01/19/2021 1437   APPEARANCEUR HAZY (A) 01/19/2021 1437   LABSPEC 1.012 01/19/2021 1437   PHURINE 5.0 01/19/2021 1437   GLUCOSEU 50 (A) 01/19/2021 1437   HGBUR NEGATIVE 01/19/2021 1437   BILIRUBINUR NEGATIVE 01/19/2021 1437   KETONESUR NEGATIVE 01/19/2021 1437   PROTEINUR 100 (A) 01/19/2021 1437   NITRITE NEGATIVE 01/19/2021 1437   LEUKOCYTESUR SMALL (A) 01/19/2021 1437    Radiological Exams on Admission: CT ABDOMEN PELVIS WO CONTRAST  Result Date:  01/19/2021 CLINICAL DATA:  Abdominal pain with recurrent diarrhea. EXAM: CT ABDOMEN AND PELVIS WITHOUT CONTRAST TECHNIQUE: Multidetector CT imaging of the abdomen and pelvis was performed following the standard protocol without IV contrast. COMPARISON:  12/26/2020 FINDINGS: Lower chest: Unremarkable Hepatobiliary: Nodular liver contour is compatible with cirrhosis (see inferior right liver on 35/2). Gallbladder is surgically absent. No intrahepatic or extrahepatic biliary dilation. Pancreas: No focal mass lesion. No dilatation of the main duct. No intraparenchymal cyst. No peripancreatic edema. Spleen: No splenomegaly. No focal mass lesion. Adrenals/Urinary Tract: No adrenal nodule or mass. 2-3 mm nonobstructing stone identified upper pole right kidney left kidney unremarkable. No evidence for hydroureter. The urinary bladder appears normal for the degree of distention. Stomach/Bowel: Stomach is unremarkable. No gastric wall thickening. No evidence of outlet obstruction. Duodenum is normally positioned as is the ligament of Treitz. No small bowel wall thickening. No small bowel dilatation. The terminal ileum is normal. The appendix is normal. No gross colonic mass. No colonic wall thickening. Vascular/Lymphatic: There is abdominal aortic atherosclerosis without aneurysm. There is no gastrohepatic or hepatoduodenal ligament lymphadenopathy. No retroperitoneal or mesenteric lymphadenopathy. No pelvic sidewall lymphadenopathy. Reproductive: The uterus is unremarkable.  There is no adnexal mass. Other: Small volume free fluid noted previously in the cul-de-sac has resolved in the interval. Musculoskeletal: No worrisome lytic or sclerotic osseous abnormality. IMPRESSION: 1. No acute findings in the abdomen or pelvis. Specifically, no findings to explain the patient's history of abdominal pain and recurrent diarrhea. 2. Nodular liver contour compatible with cirrhosis. 3. 2-3 mm nonobstructing stone upper pole right kidney.  4. Interval resolution of the trace free fluid seen in the pelvis previously. 5. Aortic Atherosclerosis (ICD10-I70.0). Electronically Signed   By: Kennith Center M.D.   On: 01/19/2021 12:43     Assessment/Plan Principal Problem:   AKI (acute kidney injury) (HCC) Active Problems:   Non-ischemic cardiomyopathy (HCC)   Tobacco dependence   Liver cirrhosis (HCC)   CKD (chronic kidney disease) stage 3, GFR 30-59 ml/min (HCC)   Gastroenteritis   Depression     Acute kidney injury Most likely prerenal and related to GI losses from poor oral intake and diarrhea At baseline patient has a serum creatinine of  1.7 and today on admission it is 2.59 Judicious IV fluid hydration and monitor respiratory status closely Hold lisinopril Repeat electrolytes in a.m.    Nonischemic cardiomyopathy  Last known LVEF of less than 25% Continue atenolol and hold lisinopril due to acute kidney injury    Hypertension with stage IIIb chronic kidney disease Continue atenolol    Depression Continue fluoxetine and trazodone    Nicotine dependence Smoking cessation has been discussed with patient and she will be placed on a nicotine transdermal patch 7 mg daily    Acute gastroenteritis Most likely viral Supportive care    Near syncope Secondary to volume depletion from GI losses We will monitor patient closely    History of liver cirrhosis from hepatitis C Stable  DVT prophylaxis: Lovenox Code Status: full code Family Communication: Greater than 50% of time was spent discussing patient's condition and plan of care with her at the bedside.  All questions and concerns have been addressed.  She verbalizes understanding and agrees with the plan. Disposition Plan: Back to previous home environment Consults called: none Status: Observation    Beryle Bagsby MD [facility]     01/19/2021, 4:30 PM

## 2021-01-19 NOTE — ED Notes (Signed)
Pt given apple sauce and orange juice

## 2021-01-19 NOTE — ED Provider Notes (Signed)
[facility] Emergency Department Provider Note ____________________________________________   Event Date/Time   First MD Initiated Contact with Patient 01/19/21 1053     (approximate)  I have reviewed the triage vital signs and the nursing notes.  HISTORY  Chief Complaint Weakness   HPI Caroline Smith is a 60 y.o. femalewho presents to the ED for evaluation of generalized weakness.  Chart review indicates history of HTN, HLD, GERD.  Nonischemic CHF with a EF of 25%.  CKD, PAD  Patient presents to the ED via EMS from home for evaluation of 2-3 days of generalized weakness, recurrent nonbloody nonbilious emesis, watery diarrhea and generalized abdominal cramping.  Denies any fevers, chest pain, shortness of breath, syncopal episodes or falls.  Reports feeling weak, tired and concerned about dehydration.  Past Medical History:  Diagnosis Date  . Anxiety   . Bronchitis   . GERD (gastroesophageal reflux disease)   . Hypertension     There are no problems to display for this patient.   History reviewed. No pertinent surgical history.  Prior to Admission medications   Medication Sig Start Date End Date Taking? Authorizing Provider  aluminum-magnesium hydroxide-simethicone (MAALOX) 200-200-20 MG/5ML SUSP Take 30 mLs by mouth 4 (four) times daily -  before meals and at bedtime. 12/23/20   Sharman Cheek, MD  amLODipine (NORVASC) 10 MG tablet Take 10 mg by mouth daily.    [provider]  atenolol (TENORMIN) 50 MG tablet Take 50 mg by mouth daily.    [provider]  diphenhydrAMINE (BENADRYL) 25 mg capsule Take 2 capsules (50 mg total) by mouth every 6 (six) hours as needed. 12/28/14   Sharman Cheek, MD  famotidine (PEPCID) 20 MG tablet Take 1 tablet (20 mg total) by mouth 2 (two) times daily. 12/23/20   Sharman Cheek, MD  FLUoxetine (PROZAC) 20 MG capsule Take 20 mg by mouth daily.    [provider]  lisinopril  (PRINIVIL,ZESTRIL) 40 MG tablet Take 40 mg by mouth daily.    [provider]  metoCLOPramide (REGLAN) 10 MG tablet Take 1 tablet (10 mg total) by mouth 4 (four) times daily -  before meals and at bedtime. 12/23/20   Sharman Cheek, MD  ondansetron (ZOFRAN) 4 MG tablet Take 4 mg by mouth every 8 (eight) hours as needed for nausea or vomiting.    [provider]  traZODone (DESYREL) 50 MG tablet Take 50 mg by mouth at bedtime as needed for sleep.    [provider]    Allergies Tramadol  No family history on file.  Social History Social History   Tobacco Use  . Smoking status: Current Every Day Smoker    Types: Cigarettes  . Smokeless tobacco: Never Used  Substance Use Topics  . Alcohol use: No  . Drug use: No    Review of Systems  Constitutional: No fever/chills Eyes: No visual changes. ENT: No sore throat. Cardiovascular: Denies chest pain. Respiratory: Denies shortness of breath. Gastrointestinal: Positive for abdominal pain, nausea, vomiting and diarrhea. No constipation. Genitourinary: Negative for dysuria. Musculoskeletal: Negative for back pain. Skin: Negative for rash. Neurological: Negative for headaches, focal weakness or numbness.  ____________________________________________   PHYSICAL EXAM:  VITAL SIGNS: Vitals:   01/19/21 1300 01/19/21 1430  BP: (!) 145/76 136/78  Pulse: 60 62  Resp: 12 14  Temp:    SpO2: 97% 94%     Constitutional: Alert and oriented.  Appears uncomfortable, but in no acute distress.  Laying beneath  multiple blankets, conversational. Eyes: Conjunctivae are normal. PERRL. EOMI. Head: Atraumatic. Nose: No congestion/rhinnorhea. Mouth/Throat: Mucous membranes are dry.  Oropharynx non-erythematous. Neck: No stridor. No cervical spine tenderness to palpation. Cardiovascular: Normal rate, regular rhythm. Grossly normal heart sounds.  Good peripheral circulation. Respiratory: Normal respiratory effort.  No  retractions. Lungs CTAB. Gastrointestinal: Soft , nondistended. No CVA tenderness. Diffuse and mild tenderness without peritoneal features. Musculoskeletal: No lower extremity tenderness nor edema.  No joint effusions. No signs of acute trauma. Neurologic:  Normal speech and language. No gross focal neurologic deficits are appreciated. Skin:  Skin is warm, dry and intact. No rash noted. Psychiatric: Mood and affect are normal. Speech and behavior are normal.  ____________________________________________   LABS (all labs ordered are listed, but only abnormal results are displayed)  Labs Reviewed  COMPREHENSIVE METABOLIC PANEL - Abnormal; Notable for the following components:      Result Value   Glucose, Bld 103 (*)    BUN 30 (*)    Creatinine, Ser 2.59 (*)    Calcium 8.5 (*)    Albumin 3.1 (*)    GFR, Estimated 21 (*)    All other components within normal limits  CBC WITH DIFFERENTIAL/PLATELET - Abnormal; Notable for the following components:   Hemoglobin 11.3 (*)    HCT 34.8 (*)    RDW 22.9 (*)    All other components within normal limits  URINALYSIS, COMPLETE (UACMP) WITH MICROSCOPIC - Abnormal; Notable for the following components:   Color, Urine YELLOW (*)    APPearance HAZY (*)    Glucose, UA 50 (*)    Protein, ur 100 (*)    Leukocytes,Ua SMALL (*)    Bacteria, UA RARE (*)    All other components within normal limits  MAGNESIUM - Abnormal; Notable for the following components:   Magnesium 3.2 (*)    All other components within normal limits  BASIC METABOLIC PANEL - Abnormal; Notable for the following components:   Glucose, Bld 104 (*)    BUN 28 (*)    Creatinine, Ser 2.36 (*)    Calcium 8.5 (*)    GFR, Estimated 23 (*)    All other components within normal limits  TROPONIN I (HIGH SENSITIVITY)   ____________________________________________  12 Lead EKG  Sinus rhythm, rate of 60 bpm.  Normal axis and intervals.  [place] baseline.  No evidence of acute  ischemia ____________________________________________  RADIOLOGY  ED MD interpretation:    Official radiology report(s): CT ABDOMEN PELVIS WO CONTRAST  Result Date: 01/19/2021 CLINICAL DATA:  Abdominal pain with recurrent diarrhea. EXAM: CT ABDOMEN AND PELVIS WITHOUT CONTRAST TECHNIQUE: Multidetector CT imaging of the abdomen and pelvis was performed following the standard protocol without IV contrast. COMPARISON:  12/26/2020 FINDINGS: Lower chest: Unremarkable Hepatobiliary: Nodular liver contour is compatible with cirrhosis (see inferior right liver on 35/2). Gallbladder is surgically absent. No intrahepatic or extrahepatic biliary dilation. Pancreas: No focal mass lesion. No dilatation of the main duct. No intraparenchymal cyst. No peripancreatic edema. Spleen: No splenomegaly. No focal mass lesion. Adrenals/Urinary Tract: No adrenal nodule or mass. 2-3 mm nonobstructing stone identified upper pole right kidney left kidney unremarkable. No evidence for hydroureter. The urinary bladder appears normal for the degree of distention. Stomach/Bowel: Stomach is unremarkable. No gastric wall thickening. No evidence of outlet obstruction. Duodenum is normally positioned as is the ligament of Treitz. No small bowel wall thickening. No small bowel dilatation. The terminal ileum is normal. The appendix is normal. No gross colonic mass. No colonic  wall thickening. Vascular/Lymphatic: There is abdominal aortic atherosclerosis without aneurysm. There is no gastrohepatic or hepatoduodenal ligament lymphadenopathy. No retroperitoneal or mesenteric lymphadenopathy. No pelvic sidewall lymphadenopathy. Reproductive: The uterus is unremarkable.  There is no adnexal mass. Other: Small volume free fluid noted previously in the cul-de-sac has resolved in the interval. Musculoskeletal: No worrisome lytic or sclerotic osseous abnormality. IMPRESSION: 1. No acute findings in the abdomen or pelvis. Specifically, no findings to  explain the patient's history of abdominal pain and recurrent diarrhea. 2. Nodular liver contour compatible with cirrhosis. 3. 2-3 mm nonobstructing stone upper pole right kidney. 4. Interval resolution of the trace free fluid seen in the pelvis previously. 5. Aortic Atherosclerosis (ICD10-I70.0). Electronically Signed   By: Kennith Center M.D.   On: 01/19/2021 12:43    ____________________________________________   PROCEDURES and INTERVENTIONS  Procedure(s) performed (including Critical Care):  Procedures  Medications  lactated ringers bolus 1,000 mL (0 mLs Intravenous Stopped 01/19/21 1454)  morphine 4 MG/ML injection 4 mg (4 mg Intravenous Given 01/19/21 1156)  ondansetron (ZOFRAN) injection 4 mg (4 mg Intravenous Given 01/19/21 1156)    ____________________________________________   MDM / ED COURSE   60 year old woman presents to the ED with generalized weakness with evidence of AKI requiring medical admission.  Normal vitals.  Exam with stigmata of dehydration, otherwise unremarkable.  No evidence of focal neurologic deficits, distress or vascular deficits.  Blood work with AKI on CKD.  She received a liter of LR with only mild improvement of her renal function after this.  CT imaging of her abdomen/pelvis is unremarkable.  Anticipate possible viral syndrome causing her emesis and diarrhea, causing her dehydration and AKI.  Considering her poor renal function and cardiac function at baseline, I think it would be prudent to admit for further gentle fluids and monitoring of her renal function.  Clinical Course as of 01/19/21 1542  Wed Jan 19, 2021  1455 Reassessed.  Patient looks better and reports feeling better.  Patient is requesting food.  We discussed recheck of her metabolic panel, p.o. challenge and likely outpatient management, she is agreeable. [DS]  1540 Reassessed.  [DS]    Clinical Course User Index [DS] Delton Prairie, MD     ____________________________________________   FINAL CLINICAL IMPRESSION(S) / ED DIAGNOSES  Final diagnoses:  Dehydration  Generalized weakness  AKI (acute kidney injury) [facility])     ED Discharge Orders    None       Storie Heffern   Note:  This document was prepared using Dragon voice recognition software and may include unintentional dictation errors.   Delton Prairie, MD 01/19/21 [id number]

## 2021-01-19 NOTE — ED Notes (Signed)
Patient transported to CT 

## 2021-01-19 NOTE — ED Triage Notes (Signed)
Pt from home via [facility]. Pt reports generalized weakness and poor PO intake. Pt received NaCL en route.

## 2021-01-19 NOTE — Plan of Care (Signed)

## 2021-01-19 NOTE — ED Notes (Signed)
Pt has visitor at bedside.

## 2021-01-19 NOTE — ED Notes (Signed)
Pt given meal tray.

## 2021-01-20 DIAGNOSIS — I700 Atherosclerosis of aorta: Secondary | ICD-10-CM | POA: Diagnosis present

## 2021-01-20 DIAGNOSIS — Z79899 Other long term (current) drug therapy: Secondary | ICD-10-CM | POA: Diagnosis not present

## 2021-01-20 DIAGNOSIS — F32A Depression, unspecified: Secondary | ICD-10-CM

## 2021-01-20 DIAGNOSIS — Z20822 Contact with and (suspected) exposure to covid-19: Secondary | ICD-10-CM | POA: Diagnosis present

## 2021-01-20 DIAGNOSIS — N1831 Chronic kidney disease, stage 3a: Secondary | ICD-10-CM | POA: Diagnosis not present

## 2021-01-20 DIAGNOSIS — E860 Dehydration: Secondary | ICD-10-CM | POA: Diagnosis not present

## 2021-01-20 DIAGNOSIS — F17210 Nicotine dependence, cigarettes, uncomplicated: Secondary | ICD-10-CM | POA: Diagnosis present

## 2021-01-20 DIAGNOSIS — A084 Viral intestinal infection, unspecified: Secondary | ICD-10-CM | POA: Diagnosis present

## 2021-01-20 DIAGNOSIS — G8929 Other chronic pain: Secondary | ICD-10-CM | POA: Diagnosis present

## 2021-01-20 DIAGNOSIS — I10 Essential (primary) hypertension: Secondary | ICD-10-CM | POA: Diagnosis not present

## 2021-01-20 DIAGNOSIS — N1832 Chronic kidney disease, stage 3b: Secondary | ICD-10-CM | POA: Diagnosis present

## 2021-01-20 DIAGNOSIS — K7460 Unspecified cirrhosis of liver: Secondary | ICD-10-CM | POA: Diagnosis present

## 2021-01-20 DIAGNOSIS — K529 Noninfective gastroenteritis and colitis, unspecified: Secondary | ICD-10-CM | POA: Diagnosis not present

## 2021-01-20 DIAGNOSIS — Z886 Allergy status to analgesic agent status: Secondary | ICD-10-CM | POA: Diagnosis not present

## 2021-01-20 DIAGNOSIS — M549 Dorsalgia, unspecified: Secondary | ICD-10-CM | POA: Diagnosis present

## 2021-01-20 DIAGNOSIS — I428 Other cardiomyopathies: Secondary | ICD-10-CM | POA: Diagnosis present

## 2021-01-20 DIAGNOSIS — N179 Acute kidney failure, unspecified: Secondary | ICD-10-CM | POA: Diagnosis not present

## 2021-01-20 DIAGNOSIS — K219 Gastro-esophageal reflux disease without esophagitis: Secondary | ICD-10-CM | POA: Diagnosis present

## 2021-01-20 DIAGNOSIS — E785 Hyperlipidemia, unspecified: Secondary | ICD-10-CM | POA: Diagnosis present

## 2021-01-20 DIAGNOSIS — I129 Hypertensive chronic kidney disease with stage 1 through stage 4 chronic kidney disease, or unspecified chronic kidney disease: Secondary | ICD-10-CM | POA: Diagnosis present

## 2021-01-20 DIAGNOSIS — R55 Syncope and collapse: Secondary | ICD-10-CM

## 2021-01-20 DIAGNOSIS — Z8249 Family history of ischemic heart disease and other diseases of the circulatory system: Secondary | ICD-10-CM | POA: Diagnosis not present

## 2021-01-20 DIAGNOSIS — B1920 Unspecified viral hepatitis C without hepatic coma: Secondary | ICD-10-CM | POA: Diagnosis present

## 2021-01-20 LAB — CBC
HCT: 30 % — ABNORMAL LOW (ref 36.0–46.0)
Hemoglobin: 10.1 g/dL — ABNORMAL LOW (ref 12.0–15.0)
MCH: 30.2 pg (ref 26.0–34.0)
MCHC: 33.7 g/dL (ref 30.0–36.0)
MCV: 89.8 fL (ref 80.0–100.0)
Platelets: 201 10*3/uL (ref 150–400)
RBC: 3.34 MIL/uL — ABNORMAL LOW (ref 3.87–5.11)
RDW: 22.7 % — ABNORMAL HIGH (ref 11.5–15.5)
WBC: 4.7 10*3/uL (ref 4.0–10.5)
nRBC: 0 % (ref 0.0–0.2)

## 2021-01-20 LAB — BASIC METABOLIC PANEL
Anion gap: 8 (ref 5–15)
BUN: 27 mg/dL — ABNORMAL HIGH (ref 6–20)
CO2: 25 mmol/L (ref 22–32)
Calcium: 8.3 mg/dL — ABNORMAL LOW (ref 8.9–10.3)
Chloride: 106 mmol/L (ref 98–111)
Creatinine, Ser: 2.14 mg/dL — ABNORMAL HIGH (ref 0.44–1.00)
GFR, Estimated: 26 mL/min — ABNORMAL LOW (ref >60)
Glucose, Bld: 79 mg/dL (ref 70–99)
Potassium: 4.2 mmol/L (ref 3.5–5.1)
Sodium: 139 mmol/L (ref 135–145)

## 2021-01-20 LAB — HIV ANTIBODY (ROUTINE TESTING W REFLEX): HIV Screen 4th Generation wRfx: NONREACTIVE

## 2021-01-20 MED ORDER — LABETALOL HCL 5 MG/ML IV SOLN
20.0000 mg | INTRAVENOUS | Status: DC | PRN
  Administered 2021-01-20 – 2021-01-23 (×6): 20 mg via INTRAVENOUS
  Filled 2021-01-20 (×6): qty 4

## 2021-01-20 MED ORDER — ISOSORBIDE MONONITRATE ER 30 MG PO TB24
30.0000 mg | ORAL_TABLET | Freq: Every day | ORAL | Status: DC
  Administered 2021-01-20 – 2021-01-22 (×3): 30 mg via ORAL
  Filled 2021-01-20 (×3): qty 1

## 2021-01-20 MED ORDER — COVID-19 MRNA VAC-TRIS(PFIZER) 30 MCG/0.3ML IM SUSP
0.3000 mL | Freq: Once | INTRAMUSCULAR | Status: AC
  Filled 2021-01-20: qty 0.3

## 2021-01-20 MED ORDER — SODIUM CHLORIDE 0.9 % IV SOLN: INTRAVENOUS | Status: DC

## 2021-01-20 MED ORDER — HYDROCODONE-ACETAMINOPHEN 5-325 MG PO TABS
1.0000 | ORAL_TABLET | Freq: Four times a day (QID) | ORAL | Status: DC | PRN
  Administered 2021-01-20 – 2021-01-22 (×2): 1 via ORAL
  Filled 2021-01-20 (×2): qty 1

## 2021-01-20 NOTE — Progress Notes (Signed)
1       [place] at [facility]   PATIENT NAME: Caroline Smith    MR#:  [id number]  DATE OF BIRTH:  1960-11-03  SUBJECTIVE:  CHIEF COMPLAINT:   Chief Complaint  Patient presents with   Weakness  Feeling weak and tired.  Complaining of back pain and requesting pain medication stronger than Tylenol wants to eat.  Has not been able to get up and walk yet  REVIEW OF SYSTEMS:  Review of Systems  Constitutional:  Positive for malaise/fatigue.  Musculoskeletal:  Positive for back pain.  Neurological:  Positive for dizziness.  All other systems reviewed and are negative. DRUG ALLERGIES:   Allergies  Allergen Reactions   Tramadol Other (See Comments) and Itching    Reaction:  Gave pt anxiety  "nervous feeling" Makes her nervous. Denied itching, rash, swelling, or difficulty breathing when she took this medicine.  07/31/19 states took 3 weeks ago did not cause itching or making her nervous it just did not help Reaction:  Gave pt anxiety  Makes her nervous "nervous feeling" Makes her nervous. Denied itching, rash, swelling, or difficulty breathing when she took this medicine.  07/31/19 states took 3 weeks ago did not cause itching or making her nervous it just did not help    VITALS:  Blood pressure (!) 161/75, pulse 72, temperature 98.8 F (37.1 C), temperature source Oral, resp. rate 18, height 5\' 7"  (1.702 m), weight 50.8 kg, last menstrual period 12/26/2012, SpO2 100 %. PHYSICAL EXAMINATION:  Physical Exam 60 year old female lying in the bed comfortably without any acute distress Eyes pupils equal round reactive to light and accommodation Lungs clear to auscultation bilaterally no wheezing rales rhonchi or crepitation Cardiovascular S1-S2 normal, no murmur rales or gallop Abdomen soft, benign Neuro alert and oriented, nonfocal exam Skin no rash or lesion LABORATORY PANEL:  Female CBC Recent Labs  Lab 01/20/21 0514  WBC 4.7  HGB 10.1*  HCT 30.0*  PLT 201    ------------------------------------------------------------------------------------------------------------------ Chemistries  Recent Labs  Lab 01/19/21 1101 01/19/21 1454 01/20/21 0514  NA 138   < > 139  K 4.6   < > 4.2  CL 103   < > 106  CO2 24   < > 25  GLUCOSE 103*   < > 79  BUN 30*   < > 27*  CREATININE 2.59*   < > 2.14*  CALCIUM 8.5*   < > 8.3*  MG 3.2*  --   --   AST 24  --   --   ALT 11  --   --   ALKPHOS 94  --   --   BILITOT 0.5  --   --    < > = values in this interval not displayed.   RADIOLOGY:  No results found. ASSESSMENT AND PLAN:  60 y.o. female with medical history significant for Hepatitis C with liver cirrhosis, cardiomyopathy with LVEF less than 25%, stage III chronic kidney disease, nicotine dependence and prior substance abuse admitted for evaluation of weakness/presyncope  Acute kidney injury Most likely prerenal and related to GI losses from poor oral intake and diarrhea At baseline patient has a serum creatinine of 1.7 and on admission it is 2.59-> 2.14 today Continue IV fluid hydration at least another 24 hours and monitor respiratory status closely Hold lisinopril Repeat electrolytes in a.m.    Nonischemic cardiomyopathy Last known LVEF of less than 25% Continue atenolol and hold lisinopril due to acute kidney injury. Resume Imdur  Hypertension with stage IIIb chronic kidney disease Continue atenolol, resume Imdur as blood pressure is trending up.  Hold hydralazine   Depression Continue fluoxetine and trazodone   Nicotine dependence Smoking cessation has been discussed with patient and she will be placed on a nicotine transdermal patch 7 mg daily    Acute gastroenteritis Most likely viral Supportive care    Near syncope Secondary to volume depletion from GI losses We will monitor patient closely.  No arrhythmia reported on telemetry Initiate progressive mobility protocol   Chronic back pain Will add Norco as needed  Body  mass index is 17.54 kg/m.  Net IO Since Admission: No IO data has been entered for this period [01/20/21 1356]      Status is: Observation  The patient remains OBS appropriate and will d/c before 2 midnights.  Dispo: The patient is from: Home              Anticipated d/c is to: Home              Patient currently is not medically stable to d/c.   Difficult to place patient No   DVT prophylaxis:       enoxaparin (LOVENOX) injection 30 mg Start: 01/19/21 2200     Family Communication:  "discussed with patient"   All the records are reviewed and case discussed with Care Management/Social Worker. Management plans discussed with the patient, nursing and they are in agreement.  CODE STATUS: Full Code Level of care: Med-Surg  TOTAL TIME TAKING CARE OF THIS PATIENT: 35 minutes.   More than 50% of the time was spent in counseling/coordination of care: YES  POSSIBLE D/C IN 1 DAYS, DEPENDING ON CLINICAL CONDITION.   Delfino Lovett M.D on 01/20/2021 at 1:56 PM  [facility]   CC: Primary care physician; Patient, No Pcp Per (Inactive)  Note: This dictation was prepared with Dragon dictation along with smaller phrase technology. Any transcriptional errors that result from this process are unintentional.

## 2021-01-20 NOTE — Progress Notes (Signed)
Dr. Sherryll Burger aware of the patient refusing bed alarm

## 2021-01-21 DIAGNOSIS — I428 Other cardiomyopathies: Secondary | ICD-10-CM

## 2021-01-21 DIAGNOSIS — R531 Weakness: Secondary | ICD-10-CM

## 2021-01-21 DIAGNOSIS — E860 Dehydration: Secondary | ICD-10-CM

## 2021-01-21 LAB — CBC
HCT: 34.6 % — ABNORMAL LOW (ref 36.0–46.0)
Hemoglobin: 11.3 g/dL — ABNORMAL LOW (ref 12.0–15.0)
MCH: 28.8 pg (ref 26.0–34.0)
MCHC: 32.7 g/dL (ref 30.0–36.0)
MCV: 88 fL (ref 80.0–100.0)
Platelets: 197 10*3/uL (ref 150–400)
RBC: 3.93 MIL/uL (ref 3.87–5.11)
RDW: 22.4 % — ABNORMAL HIGH (ref 11.5–15.5)
WBC: 4.6 10*3/uL (ref 4.0–10.5)
nRBC: 0 % (ref 0.0–0.2)

## 2021-01-21 LAB — BASIC METABOLIC PANEL
Anion gap: 8 (ref 5–15)
BUN: 18 mg/dL (ref 6–20)
CO2: 26 mmol/L (ref 22–32)
Calcium: 9 mg/dL (ref 8.9–10.3)
Chloride: 107 mmol/L (ref 98–111)
Creatinine, Ser: 1.74 mg/dL — ABNORMAL HIGH (ref 0.44–1.00)
GFR, Estimated: 34 mL/min — ABNORMAL LOW (ref >60)
Glucose, Bld: 88 mg/dL (ref 70–99)
Potassium: 3.9 mmol/L (ref 3.5–5.1)
Sodium: 141 mmol/L (ref 135–145)

## 2021-01-21 MED ORDER — HYDRALAZINE HCL 20 MG/ML IJ SOLN
10.0000 mg | Freq: Four times a day (QID) | INTRAMUSCULAR | Status: DC | PRN
  Administered 2021-01-21 – 2021-01-22 (×3): 10 mg via INTRAVENOUS
  Filled 2021-01-21 (×3): qty 1

## 2021-01-21 MED ORDER — HYDRALAZINE HCL 50 MG PO TABS
50.0000 mg | ORAL_TABLET | Freq: Two times a day (BID) | ORAL | Status: DC
  Administered 2021-01-21 (×2): 50 mg via ORAL
  Filled 2021-01-21 (×2): qty 1

## 2021-01-21 MED ORDER — CLOPIDOGREL BISULFATE 75 MG PO TABS
75.0000 mg | ORAL_TABLET | Freq: Every day | ORAL | Status: DC
  Administered 2021-01-21 – 2021-01-23 (×3): 75 mg via ORAL
  Filled 2021-01-21 (×4): qty 1

## 2021-01-21 MED ORDER — CARVEDILOL 25 MG PO TABS: 25.0000 mg | ORAL_TABLET | Freq: Two times a day (BID) | ORAL | 0 refills | Status: AC

## 2021-01-21 MED ORDER — HYDRALAZINE HCL 50 MG PO TABS: 50.0000 mg | ORAL_TABLET | Freq: Two times a day (BID) | ORAL | 0 refills | Status: DC

## 2021-01-21 MED ORDER — ASPIRIN EC 81 MG PO TBEC
81.0000 mg | DELAYED_RELEASE_TABLET | Freq: Every day | ORAL | Status: DC
  Administered 2021-01-21 – 2021-01-23 (×3): 81 mg via ORAL
  Filled 2021-01-21 (×4): qty 1

## 2021-01-21 MED ORDER — CARVEDILOL 25 MG PO TABS
25.0000 mg | ORAL_TABLET | Freq: Two times a day (BID) | ORAL | Status: DC
  Administered 2021-01-21 – 2021-01-23 (×5): 25 mg via ORAL
  Filled 2021-01-21 (×6): qty 1

## 2021-01-21 NOTE — Progress Notes (Signed)
   01/20/21 2350  Assess: MEWS Score  Temp 97.9 F (36.6 C)  BP (!) 210/97  Pulse Rate 70  Resp 18  SpO2 100 %  O2 Device Room Air  Assess: MEWS Score  MEWS Temp 0  MEWS Systolic 2  MEWS Pulse 0  MEWS RR 0  MEWS LOC 0  MEWS Score 2  MEWS Score Color Yellow  Treat  Pain Scale 0-10  Pain Score 0  Take Vital Signs  Increase Vital Sign Frequency  Yellow: Q 2hr X 2 then Q 4hr X 2, if remains yellow, continue Q 4hrs  Escalate  MEWS: Escalate Yellow: discuss with charge nurse/RN and consider discussing with provider and RRT  Notify: Charge Nurse/RN  Name of Charge Nurse/RN Notified Dawn,RN  Date Charge Nurse/RN Notified 01/21/21  Time Charge Nurse/RN Notified 0043  Notify: Provider  Provider Name/Title Dr. Arville Care  Date Provider Notified 01/21/21  Time Provider Notified 0010  Notification Type Page  Notification Reason Other (Comment) (BP interventions did not work)  Provider response No new orders  Document  Patient Outcome Stabilized after interventions

## 2021-01-21 NOTE — Progress Notes (Signed)
1       [place] at [facility]   PATIENT NAME: Caroline Smith    MR#:  [id number]  DATE OF BIRTH:  [birth year]  SUBJECTIVE:  CHIEF COMPLAINT:   Chief Complaint  Patient presents with   Weakness  No new c/o, tolerating diet. BP running high REVIEW OF SYSTEMS:  Review of Systems  Constitutional:  Positive for malaise/fatigue.  Musculoskeletal:  Positive for back pain.  All other systems reviewed and are negative. DRUG ALLERGIES:   Allergies  Allergen Reactions   Tramadol Other (See Comments) and Itching    Reaction:  Gave pt anxiety  "nervous feeling" Makes her nervous. Denied itching, rash, swelling, or difficulty breathing when she took this medicine.  07/31/19 states took 3 weeks ago did not cause itching or making her nervous it just did not help Reaction:  Gave pt anxiety  Makes her nervous "nervous feeling" Makes her nervous. Denied itching, rash, swelling, or difficulty breathing when she took this medicine.  07/31/19 states took 3 weeks ago did not cause itching or making her nervous it just did not help    VITALS:  Blood pressure (!) 184/91, pulse 80, temperature 98.7 F (37.1 C), temperature source Oral, resp. rate 14, height 5\' 7"  (1.702 m), weight 50.8 kg, last menstrual period 12/26/2012, SpO2 100 %. PHYSICAL EXAMINATION:  Physical Exam 60 year old female lying in the bed comfortably without any acute distress Eyes pupils equal round reactive to light and accommodation Lungs clear to auscultation bilaterally no wheezing rales rhonchi or crepitation Cardiovascular S1-S2 normal, no murmur rales or gallop Abdomen soft, benign Neuro alert and oriented, nonfocal exam Skin no rash or lesion LABORATORY PANEL:  Female CBC Recent Labs  Lab 01/21/21 0447  WBC 4.6  HGB 11.3*  HCT 34.6*  PLT 197    ------------------------------------------------------------------------------------------------------------------ Chemistries  Recent Labs  Lab  01/19/21 1101 01/19/21 1454 01/21/21 0447  NA 138   < > 141  K 4.6   < > 3.9  CL 103   < > 107  CO2 24   < > 26  GLUCOSE 103*   < > 88  BUN 30*   < > 18  CREATININE 2.59*   < > 1.74*  CALCIUM 8.5*   < > 9.0  MG 3.2*  --   --   AST 24  --   --   ALT 11  --   --   ALKPHOS 94  --   --   BILITOT 0.5  --   --    < > = values in this interval not displayed.    RADIOLOGY:  No results found. ASSESSMENT AND PLAN:  59 y.o. female with medical history significant for Hepatitis C with liver cirrhosis, cardiomyopathy with LVEF less than 25%, stage III chronic kidney disease, nicotine dependence and prior substance abuse admitted for evaluation of weakness/presyncope  Acute kidney injury Most likely prerenal and related to GI losses from poor oral intake and diarrhea At baseline patient has a serum creatinine of 1.7 improving with hydration 2.59-> 2.14->1.74 today Hold lisinopril   Nonischemic cardiomyopathy Last known LVEF of less than 25%  hold lisinopril due to acute kidney injury. continue Imdur, stop atenolol and start coreg and hydralazine   Hypertension with stage IIIb chronic kidney disease continue Imdur, stop atenolol and start coreg and hydralazine. Lisinopril on hold due to aki   Depression Continue fluoxetine and trazodone   Nicotine dependence Smoking cessation has been discussed with patient and she  will be placed on a nicotine transdermal patch 7 mg daily    Acute gastroenteritis Most likely viral Supportive care    Near syncope Secondary to volume depletion from GI losses No further dizziness   Chronic back pain Norco as needed   Was planned on D/C today as recheck BP improved after giving coreg and hydralazine. Patient was in agreement earlier but this evening I'm notified by RN that patient refused to leave as BP going up again.  Body mass index is 17.54 kg/m.  Net IO Since Admission: 0 mL [01/21/21 1922]     Status is: Inpatient  Remains  inpatient appropriate because:Hemodynamically unstable  Dispo: The patient is from: Home              Anticipated d/c is to: Home              Patient currently is not medically stable to d/c.   Difficult to place patient No   DVT prophylaxis:       enoxaparin (LOVENOX) injection 30 mg Start: 01/19/21 2200     Family Communication:  "discussed with patient"   All the records are reviewed and case discussed with Care Management/Social Worker. Management plans discussed with the patient, nursing and they are in agreement.  CODE STATUS: Full Code Level of care: Med-Surg  TOTAL TIME TAKING CARE OF THIS PATIENT: 35 minutes.   More than 50% of the time was spent in counseling/coordination of care: YES  POSSIBLE D/C IN 1-2 DAYS, DEPENDING ON BP control   Delfino Lovett M.D on 01/21/2021 at 7:22 PM  [facility]   CC: Primary care physician; Rickey Primus, FNP  Note: This dictation was prepared with Dragon dictation along with smaller phrase technology. Any transcriptional errors that result from this process are unintentional.

## 2021-01-21 NOTE — Progress Notes (Signed)
   01/21/21 0449  Assess: MEWS Score  Temp 98.1 F (36.7 C)  BP (!) 186/80  Pulse Rate 77  Resp 16  SpO2 99 %  O2 Device Room Air  Assess: MEWS Score  MEWS Temp 0  MEWS Systolic 0  MEWS Pulse 0  MEWS RR 0  MEWS LOC 0  MEWS Score 0  MEWS Score Color Green   New orders were received and BP is trending down. Pt continues to deny chest pain but did have an episode of vomiting accompanied with a headache. PRN medications were effective to assist with the symptoms.

## 2021-01-21 NOTE — Progress Notes (Signed)
Attending MD informed.  Patient did not discharge home as BP continues to stay high. Patient also stated she is uncomfortable discharging today and would rather be watched tonight and discharge tomorrow. IV fluids discontinued.

## 2021-01-22 DIAGNOSIS — I10 Essential (primary) hypertension: Secondary | ICD-10-CM

## 2021-01-22 LAB — BASIC METABOLIC PANEL
Anion gap: 7 (ref 5–15)
BUN: 11 mg/dL (ref 6–20)
CO2: 25 mmol/L (ref 22–32)
Calcium: 9.2 mg/dL (ref 8.9–10.3)
Chloride: 106 mmol/L (ref 98–111)
Creatinine, Ser: 1.57 mg/dL — ABNORMAL HIGH (ref 0.44–1.00)
GFR, Estimated: 38 mL/min — ABNORMAL LOW (ref >60)
Glucose, Bld: 98 mg/dL (ref 70–99)
Potassium: 3.8 mmol/L (ref 3.5–5.1)
Sodium: 138 mmol/L (ref 135–145)

## 2021-01-22 LAB — CBC
HCT: 35.2 % — ABNORMAL LOW (ref 36.0–46.0)
Hemoglobin: 11.6 g/dL — ABNORMAL LOW (ref 12.0–15.0)
MCH: 28.6 pg (ref 26.0–34.0)
MCHC: 33 g/dL (ref 30.0–36.0)
MCV: 86.9 fL (ref 80.0–100.0)
Platelets: 211 10*3/uL (ref 150–400)
RBC: 4.05 MIL/uL (ref 3.87–5.11)
RDW: 22.5 % — ABNORMAL HIGH (ref 11.5–15.5)
WBC: 6 10*3/uL (ref 4.0–10.5)
nRBC: 0 % (ref 0.0–0.2)

## 2021-01-22 MED ORDER — ENOXAPARIN SODIUM 40 MG/0.4ML IJ SOSY
40.0000 mg | PREFILLED_SYRINGE | INTRAMUSCULAR | Status: DC
  Administered 2021-01-22: 40 mg via SUBCUTANEOUS
  Filled 2021-01-22: qty 0.4

## 2021-01-22 MED ORDER — IPRATROPIUM-ALBUTEROL 0.5-2.5 (3) MG/3ML IN SOLN: 3.0000 mL | Freq: Four times a day (QID) | RESPIRATORY_TRACT | Status: DC | PRN

## 2021-01-22 MED ORDER — HYDRALAZINE HCL 50 MG PO TABS: 100.0000 mg | ORAL_TABLET | Freq: Four times a day (QID) | ORAL | Status: DC

## 2021-01-22 MED ORDER — BUDESONIDE 0.5 MG/2ML IN SUSP
0.5000 mg | Freq: Two times a day (BID) | RESPIRATORY_TRACT | Status: DC
Start: 2021-01-22 — End: 2021-01-23
  Administered 2021-01-22 – 2021-01-23 (×2): 0.5 mg via RESPIRATORY_TRACT
  Filled 2021-01-22 (×2): qty 2

## 2021-01-22 MED ORDER — HYDRALAZINE HCL 50 MG PO TABS
100.0000 mg | ORAL_TABLET | Freq: Four times a day (QID) | ORAL | Status: DC
  Administered 2021-01-22 – 2021-01-23 (×4): 100 mg via ORAL
  Filled 2021-01-22 (×4): qty 2

## 2021-01-22 NOTE — Plan of Care (Signed)

## 2021-01-22 NOTE — Progress Notes (Signed)
1       [place] at [facility]   PATIENT NAME: Caroline Smith    MR#:  [id number]  DATE OF BIRTH:  03/24/1961  SUBJECTIVE:  CHIEF COMPLAINT:   Chief Complaint  Patient presents with   Weakness  No new c/o, tolerating diet. BP difficult to control REVIEW OF SYSTEMS:  Review of Systems  Constitutional:  Positive for malaise/fatigue.  Musculoskeletal:  Positive for back pain.  All other systems reviewed and are negative. DRUG ALLERGIES:   Allergies  Allergen Reactions   Tramadol Other (See Comments) and Itching    Reaction:  Gave pt anxiety  "nervous feeling" Makes her nervous. Denied itching, rash, swelling, or difficulty breathing when she took this medicine.  07/31/19 states took 3 weeks ago did not cause itching or making her nervous it just did not help Reaction:  Gave pt anxiety  Makes her nervous "nervous feeling" Makes her nervous. Denied itching, rash, swelling, or difficulty breathing when she took this medicine.  07/31/19 states took 3 weeks ago did not cause itching or making her nervous it just did not help    VITALS:  Blood pressure (!) 183/98, pulse 76, temperature 98.4 F (36.9 C), temperature source Oral, resp. rate 18, height 5\' 7"  (1.702 m), weight 50.8 kg, last menstrual period 12/26/2012, SpO2 100 %. PHYSICAL EXAMINATION:  Physical Exam 60 year old female lying in the bed comfortably without any acute distress Eyes pupils equal round reactive to light and accommodation Lungs clear to auscultation bilaterally no wheezing rales rhonchi or crepitation Cardiovascular S1-S2 normal, no murmur rales or gallop Abdomen soft, benign Neuro alert and oriented, nonfocal exam Skin no rash or lesion LABORATORY PANEL:  Female CBC Recent Labs  Lab 01/22/21 0420  WBC 6.0  HGB 11.6*  HCT 35.2*  PLT 211    ------------------------------------------------------------------------------------------------------------------ Chemistries  Recent Labs   Lab 01/19/21 1101 01/19/21 1454 01/22/21 0420  NA 138   < > 138  K 4.6   < > 3.8  CL 103   < > 106  CO2 24   < > 25  GLUCOSE 103*   < > 98  BUN 30*   < > 11  CREATININE 2.59*   < > 1.57*  CALCIUM 8.5*   < > 9.2  MG 3.2*  --   --   AST 24  --   --   ALT 11  --   --   ALKPHOS 94  --   --   BILITOT 0.5  --   --    < > = values in this interval not displayed.    RADIOLOGY:  No results found. ASSESSMENT AND PLAN:  60 y.o. female with medical history significant for Hepatitis C with liver cirrhosis, cardiomyopathy with LVEF less than 25%, stage III chronic kidney disease, nicotine dependence and prior substance abuse admitted for evaluation of weakness/presyncope  Acute kidney injury Most likely prerenal and related to GI losses from poor oral intake and diarrhea improving with hydration 2.59->1.57  Hold lisinopril   Nonischemic cardiomyopathy Last known LVEF of less than 25%  hold lisinopril due to acute kidney injury. continue Imdur,, Coreg    Hypertension with stage IIIb chronic kidney disease continue Imdur, coreg and increase hydralazine to 100 mg p.o. every 6 hours for better blood pressure control. Lisinopril on hold due to aki As needed labetalol and hydralazine   Depression Continue fluoxetine and trazodone   Nicotine dependence Smoking cessation has been discussed with patient and she  will be placed on a nicotine transdermal patch 7 mg daily    Acute gastroenteritis Most likely viral Supportive care    Near syncope Secondary to volume depletion from GI losses No further dizziness   Chronic back pain Norco as needed   Blood pressure remains difficult to control  Body mass index is 17.54 kg/m.  Net IO Since Admission: 0 mL [01/22/21 1242]     Status is: Inpatient  Remains inpatient appropriate because:Hemodynamically unstable  Dispo: The patient is from: Home              Anticipated d/c is to: Home              Patient currently is not  medically stable to d/c.  Uncontrolled blood pressure   Difficult to place patient No   DVT prophylaxis:       enoxaparin (LOVENOX) injection 40 mg Start: 01/22/21 2200     Family Communication:  "discussed with patient"   All the records are reviewed and case discussed with Care Management/Social Worker. Management plans discussed with the patient, nursing and they are in agreement.  CODE STATUS: Full Code Level of care: Med-Surg  TOTAL TIME TAKING CARE OF THIS PATIENT: 35 minutes.   More than 50% of the time was spent in counseling/coordination of care: YES  POSSIBLE D/C IN 1-2 DAYS, DEPENDING ON BP control   Delfino Lovett M.D on 01/22/2021 at 12:42 PM  [facility]   CC: Primary care physician; No primary care provider on file.  Note: This dictation was prepared with Dragon dictation along with smaller phrase technology. Any transcriptional errors that result from this process are unintentional.

## 2021-01-22 NOTE — Progress Notes (Signed)
PHARMACIST - PHYSICIAN COMMUNICATION  CONCERNING:  Enoxaparin (Lovenox) for DVT Prophylaxis    RECOMMENDATION: Patient was prescribed enoxaparin 40mg  q24 hours for VTE prophylaxis.   Filed Weights   01/19/21 1055  Weight: 50.8 kg (112 lb)    Body mass index is 17.54 kg/m.  Estimated Creatinine Clearance: 31.3 mL/min (A) (by C-G formula based on SCr of 1.57 mg/dL (H)).   Patient is candidate for enoxaparin 40mg  every 24 hours based on CrCl >46ml/min and Weight >45kg  DESCRIPTION: Pharmacy has adjusted enoxaparin dose per [facility] policy.  Patient is now receiving enoxaparin 40 mg every 24 hours    31m 01/22/2021 7:30 AM

## 2021-01-22 NOTE — Progress Notes (Signed)
Mobility Specialist - Progress Note   01/22/21 1200  Mobility  Activity Ambulated in hall  Level of Assistance Independent  Assistive Device None  Distance Ambulated (ft) 480 ft  Mobility Ambulated independently in hallway  Mobility Response Tolerated well  Mobility performed by Mobility specialist  $Mobility charge 1 Mobility    Pre-mobility: 76 HR, 100% SpO2 During mobility: 90 HR, 99% SpO2 Post-mobility: 77 HR, 99% SpO2   Pt ambulated in hallway independently. No LOB. Denied dizziness. Denied SOB on RA. O2 maintained high 90s. BP taken prior to activity: 162/96.    Filiberto Pinks Mobility Specialist 01/22/21, 12:48 PM

## 2021-01-23 DIAGNOSIS — I10 Essential (primary) hypertension: Secondary | ICD-10-CM

## 2021-01-23 MED ORDER — LISINOPRIL 20 MG PO TABS
20.0000 mg | ORAL_TABLET | Freq: Every day | ORAL | Status: DC
  Administered 2021-01-23: 20 mg via ORAL
  Filled 2021-01-23: qty 1

## 2021-01-23 MED ORDER — ISOSORBIDE MONONITRATE ER 60 MG PO TB24: 60.0000 mg | ORAL_TABLET | Freq: Every day | ORAL | 0 refills | Status: DC

## 2021-01-23 MED ORDER — CLONIDINE HCL 0.1 MG PO TABS
0.1000 mg | ORAL_TABLET | Freq: Two times a day (BID) | ORAL | Status: DC
  Administered 2021-01-23: 0.1 mg via ORAL
  Filled 2021-01-23: qty 1

## 2021-01-23 MED ORDER — ISOSORBIDE MONONITRATE ER 30 MG PO TB24
60.0000 mg | ORAL_TABLET | Freq: Every day | ORAL | Status: DC
  Administered 2021-01-23: 60 mg via ORAL
  Filled 2021-01-23: qty 2

## 2021-01-23 MED ORDER — HYDRALAZINE HCL 50 MG PO TABS: 50.0000 mg | ORAL_TABLET | Freq: Four times a day (QID) | ORAL | 0 refills | Status: DC

## 2021-01-23 MED ORDER — AMLODIPINE BESYLATE 10 MG PO TABS
10.0000 mg | ORAL_TABLET | Freq: Every day | ORAL | Status: DC
  Administered 2021-01-23: 10 mg via ORAL
  Filled 2021-01-23: qty 1

## 2021-01-23 NOTE — Plan of Care (Signed)
  Problem: Education: Goal: Knowledge of General Education information will improve Description: Including pain rating scale, medication(s)/side effects and non-pharmacologic comfort measures 01/23/2021 1158 by Gerarda Gunther, RN Outcome: Adequate for Discharge 01/23/2021 0800 by Gerarda Gunther, RN Outcome: Progressing   Problem: Health Behavior/Discharge Planning: Goal: Ability to manage health-related needs will improve 01/23/2021 1158 by Gerarda Gunther, RN Outcome: Adequate for Discharge 01/23/2021 0800 by Gerarda Gunther, RN Outcome: Progressing   Problem: Clinical Measurements: Goal: Ability to maintain clinical measurements within normal limits will improve 01/23/2021 1158 by Gerarda Gunther, RN Outcome: Adequate for Discharge 01/23/2021 0800 by Gerarda Gunther, RN Outcome: Progressing Goal: Will remain free from infection 01/23/2021 1158 by Gerarda Gunther, RN Outcome: Adequate for Discharge 01/23/2021 0800 by Gerarda Gunther, RN Outcome: Progressing Goal: Diagnostic test results will improve 01/23/2021 1158 by Gerarda Gunther, RN Outcome: Adequate for Discharge 01/23/2021 0800 by Gerarda Gunther, RN Outcome: Progressing Goal: Respiratory complications will improve 01/23/2021 1158 by Gerarda Gunther, RN Outcome: Adequate for Discharge 01/23/2021 0800 by Gerarda Gunther, RN Outcome: Progressing Goal: Cardiovascular complication will be avoided 01/23/2021 1158 by Gerarda Gunther, RN Outcome: Adequate for Discharge 01/23/2021 0800 by Gerarda Gunther, RN Outcome: Progressing   Problem: Activity: Goal: Risk for activity intolerance will decrease 01/23/2021 1158 by Gerarda Gunther, RN Outcome: Adequate for Discharge 01/23/2021 0800 by Gerarda Gunther, RN Outcome: Progressing   Problem: Nutrition: Goal: Adequate nutrition will be maintained 01/23/2021 1158 by Gerarda Gunther, RN Outcome: Adequate for Discharge 01/23/2021 0800 by Gerarda Gunther, RN Outcome: Progressing   Problem:  Coping: Goal: Level of anxiety will decrease 01/23/2021 1158 by Gerarda Gunther, RN Outcome: Adequate for Discharge 01/23/2021 0800 by Gerarda Gunther, RN Outcome: Progressing   Problem: Elimination: Goal: Will not experience complications related to bowel motility 01/23/2021 1158 by Gerarda Gunther, RN Outcome: Adequate for Discharge 01/23/2021 0800 by Gerarda Gunther, RN Outcome: Progressing Goal: Will not experience complications related to urinary retention 01/23/2021 1158 by Gerarda Gunther, RN Outcome: Adequate for Discharge 01/23/2021 0800 by Gerarda Gunther, RN Outcome: Progressing   Problem: Pain Managment: Goal: General experience of comfort will improve 01/23/2021 1158 by Gerarda Gunther, RN Outcome: Adequate for Discharge 01/23/2021 0800 by Gerarda Gunther, RN Outcome: Progressing   Problem: Safety: Goal: Ability to remain free from injury will improve 01/23/2021 1158 by Gerarda Gunther, RN Outcome: Adequate for Discharge 01/23/2021 0800 by Gerarda Gunther, RN Outcome: Progressing   Problem: Skin Integrity: Goal: Risk for impaired skin integrity will decrease 01/23/2021 1158 by Gerarda Gunther, RN Outcome: Adequate for Discharge 01/23/2021 0800 by Gerarda Gunther, RN Outcome: Progressing

## 2021-01-23 NOTE — Plan of Care (Signed)

## 2021-01-23 NOTE — Discharge Summary (Signed)
5       [place] [facility]   PATIENT NAME: Caroline Smith    MR#:  [id number]  DATE OF BIRTH:  April 15, 1961  DATE OF ADMISSION:  01/19/2021   ADMITTING PHYSICIAN: Delfino Lovett, MD  DATE OF DISCHARGE: 01/23/2021  1:08 PM  PRIMARY CARE PHYSICIAN: No primary care provider on file.   ADMISSION DIAGNOSIS:  Dehydration [E86.0] Generalized weakness [R53.1] AKI (acute kidney injury) (HCC) [N17.9] DISCHARGE DIAGNOSIS:  Principal Problem:   AKI (acute kidney injury) (HCC) Active Problems:   Non-ischemic cardiomyopathy (HCC)   Tobacco dependence   Liver cirrhosis (HCC)   CKD (chronic kidney disease) stage 3, GFR 30-59 ml/min (HCC)   Gastroenteritis   Depression   Near syncope   Dehydration   Generalized weakness   Uncontrolled hypertension  SECONDARY DIAGNOSIS:   Past Medical History:  Diagnosis Date   Anxiety    Bronchitis    GERD (gastroesophageal reflux disease)    Hypertension    HOSPITAL COURSE:  60 y.o. female with medical history significant for Hepatitis C with liver cirrhosis, cardiomyopathy with LVEF less than 25%, stage III chronic kidney disease, nicotine dependence and prior substance abuse admitted for evaluation of weakness/presyncope   Acute kidney injury Most likely prerenal and related to GI losses from poor oral intake and diarrhea i lisinopril held.  Kidney function improved with hydration   Nonischemic cardiomyopathy Last known LVEF of less than 25%  held lisinopril due to acute kidney injury. continue Imdur, Coreg    Labile hypertension with stage IIIb chronic kidney disease Resume home regimen.  Increase hydralazine to 4 times daily dosing and Imdur to 60 mg p.o. daily from 30 mg daily at discharge for better blood pressure control.  Counseled her for diet and medication compliance as I am afraid that is contributing to her uncontrolled hypertension   Depression Continue fluoxetine and trazodone   Nicotine dependence Smoking  cessation has been discussed with patient    Acute gastroenteritis Most likely viral Improved    Near syncope Secondary to volume depletion from GI losses No further dizziness   Chronic back pain Controlled   Blood pressure remained difficult to control while in the hospital.  Her last blood pressure was recorded 123/83 and patient demanded to leave ASAP.  She is not willing to stay anymore in the hospital.  DISCHARGE CONDITIONS:  Stable CONSULTS OBTAINED:   DRUG ALLERGIES:   Allergies  Allergen Reactions   Tramadol Other (See Comments) and Itching    Reaction:  Gave pt anxiety  "nervous feeling" Makes her nervous. Denied itching, rash, swelling, or difficulty breathing when she took this medicine.  07/31/19 states took 3 weeks ago did not cause itching or making her nervous it just did not help Reaction:  Gave pt anxiety  Makes her nervous "nervous feeling" Makes her nervous. Denied itching, rash, swelling, or difficulty breathing when she took this medicine.  07/31/19 states took 3 weeks ago did not cause itching or making her nervous it just did not help    DISCHARGE MEDICATIONS:   Allergies as of 01/23/2021       Reactions   Tramadol Other (See Comments), Itching   Reaction:  Gave pt anxiety  "nervous feeling" Makes her nervous. Denied itching, rash, swelling, or difficulty breathing when she took this medicine. 07/31/19 states took 3 weeks ago did not cause itching or making her nervous it just did not help Reaction:  Gave pt anxiety  Makes her nervous "nervous feeling"  Makes her nervous. Denied itching, rash, swelling, or difficulty breathing when she took this medicine. 07/31/19 states took 3 weeks ago did not cause itching or making her nervous it just did not help        Medication List     STOP taking these medications    DULoxetine 30 MG capsule Commonly known as: CYMBALTA   furosemide 40 MG tablet Commonly known as: LASIX   lisinopril  20 MG tablet Commonly known as: ZESTRIL   ondansetron 4 MG disintegrating tablet Commonly known as: ZOFRAN-ODT   pantoprazole 20 MG tablet Commonly known as: PROTONIX   pregabalin 100 MG capsule Commonly known as: LYRICA       TAKE these medications    acetaminophen 500 MG tablet Commonly known as: TYLENOL Take 1,000 mg by mouth every 6 (six) hours as needed for mild pain.   Allergy Relief 10 MG tablet Generic drug: loratadine Take 10 mg by mouth daily.   aluminum-magnesium hydroxide-simethicone 200-200-20 MG/5ML Susp Commonly known as: MAALOX Take 30 mLs by mouth 4 (four) times daily -  before meals and at bedtime.   amLODipine 10 MG tablet Commonly known as: NORVASC Take 10 mg by mouth daily.   Aspirin Low Dose 81 MG EC tablet Generic drug: aspirin Take 81 mg by mouth daily.   atorvastatin 80 MG tablet Commonly known as: LIPITOR Take 80 mg by mouth at bedtime.   carvedilol 25 MG tablet Commonly known as: COREG Take 1 tablet (25 mg total) by mouth 2 (two) times daily with a meal.   clopidogrel 75 MG tablet Commonly known as: PLAVIX Take 75 mg by mouth daily.   gabapentin 100 MG capsule Commonly known as: NEURONTIN Take 100 mg by mouth 3 (three) times daily.   hydrALAZINE 50 MG tablet Commonly known as: APRESOLINE Take 1 tablet (50 mg total) by mouth 4 (four) times daily. What changed: when to take this   isosorbide mononitrate 60 MG 24 hr tablet Commonly known as: IMDUR Take 1 tablet (60 mg total) by mouth daily. Start taking on: January 24, 2021 What changed:  medication strength how much to take   Jardiance 10 MG Tabs tablet Generic drug: empagliflozin Take 10 mg by mouth daily.   metoCLOPramide 10 MG tablet Commonly known as: REGLAN Take 1 tablet (10 mg total) by mouth 4 (four) times daily -  before meals and at bedtime.   omeprazole 20 MG capsule Commonly known as: PRILOSEC Take 20 mg by mouth daily.   ondansetron 4 MG tablet Commonly  known as: ZOFRAN Take 4 mg by mouth every 8 (eight) hours as needed for nausea or vomiting.   albuterol (2.5 MG/3ML) 0.083% nebulizer solution Commonly known as: PROVENTIL Inhale 3 mLs into the lungs every 6 (six) hours as needed for wheezing.   ProAir HFA 108 (90 Base) MCG/ACT inhaler Generic drug: albuterol Inhale 2 puffs into the lungs every 6 (six) hours as needed for wheezing or shortness of breath.   traZODone 50 MG tablet Commonly known as: DESYREL Take 50 mg by mouth at bedtime as needed for sleep.       DISCHARGE INSTRUCTIONS:   DIET:  Cardiac diet DISCHARGE CONDITION:  Stable ACTIVITY:  Activity as tolerated OXYGEN:  Home Oxygen: No.  Oxygen Delivery: room air DISCHARGE LOCATION:  home   If you experience worsening of your admission symptoms, develop shortness of breath, life threatening emergency, suicidal or homicidal thoughts you must seek medical attention immediately by calling 911 or calling  your MD immediately  if symptoms less severe.  You Must read complete instructions/literature along with all the possible adverse reactions/side effects for all the Medicines you take and that have been prescribed to you. Take any new Medicines after you have completely understood and accpet all the possible adverse reactions/side effects.   Please note  You were cared for by a hospitalist during your hospital stay. If you have any questions about your discharge medications or the care you received while you were in the hospital after you are discharged, you can call the unit and asked to speak with the hospitalist on call if the hospitalist that took care of you is not available. Once you are discharged, your primary care physician will handle any further medical issues. Please note that NO REFILLS for any discharge medications will be authorized once you are discharged, as it is imperative that you return to your primary care physician (or establish a relationship with a  primary care physician if you do not have one) for your aftercare needs so that they can reassess your need for medications and monitor your lab values.    On the day of Discharge:  VITAL SIGNS:  Blood pressure 123/83, pulse 68, temperature 97.7 F (36.5 C), temperature source Oral, resp. rate 18, height 5\' 7"  (1.702 m), weight 50.8 kg, last menstrual period 12/26/2012, SpO2 99 %. PHYSICAL EXAMINATION:  GENERAL:  60 y.o.-year-old patient lying in the bed with no acute distress.  EYES: Pupils equal, round, reactive to light and accommodation. No scleral icterus. Extraocular muscles intact.  HEENT: Head atraumatic, normocephalic. Oropharynx and nasopharynx clear.  NECK:  Supple, no jugular venous distention. No thyroid enlargement, no tenderness.  LUNGS: Normal breath sounds bilaterally, no wheezing, rales,rhonchi or crepitation. No use of accessory muscles of respiration.  CARDIOVASCULAR: S1, S2 normal. No murmurs, rubs, or gallops.  ABDOMEN: Soft, non-tender, non-distended. Bowel sounds present. No organomegaly or mass.  EXTREMITIES: No pedal edema, cyanosis, or clubbing.  NEUROLOGIC: Cranial nerves II through XII are intact. Muscle strength 5/5 in all extremities. Sensation intact. Gait not checked.  PSYCHIATRIC: The patient is alert and oriented x 3.  SKIN: No obvious rash, lesion, or ulcer.  DATA REVIEW:   CBC Recent Labs  Lab 01/22/21 0420  WBC 6.0  HGB 11.6*  HCT 35.2*  PLT 211    Chemistries  Recent Labs  Lab 01/19/21 1101 01/19/21 1454 01/22/21 0420  NA 138   < > 138  K 4.6   < > 3.8  CL 103   < > 106  CO2 24   < > 25  GLUCOSE 103*   < > 98  BUN 30*   < > 11  CREATININE 2.59*   < > 1.57*  CALCIUM 8.5*   < > 9.2  MG 3.2*  --   --   AST 24  --   --   ALT 11  --   --   ALKPHOS 94  --   --   BILITOT 0.5  --   --    < > = values in this interval not displayed.     Outpatient follow-up  Follow-up Information     03/24/21. Schedule an  appointment as soon as possible for a visit in 2 day(s).   Why: [facility] Discharge F/UP Contact information: [address]   [facility] [place], [place]   [phone] (Work)   [phone] (Fax)  30 Day Unplanned Readmission Risk Score    Flowsheet Row ED to Hosp-Admission (Discharged) from 01/19/2021 in [facility]  30 Day Unplanned Readmission Risk Score (%) 20.29 Filed at 01/23/2021 1200       This score is the patient's risk of an unplanned readmission within 30 days of being discharged (0 -100%). The score is based on dignosis, age, lab data, medications, orders, and past utilization.   Low:  0-14.9   Medium: 15-21.9   High: 22-29.9   Extreme: 30 and above           Management plans discussed with the patient, family and they are in agreement.  CODE STATUS: Full Code   TOTAL TIME TAKING CARE OF THIS PATIENT: 45 minutes.    Delfino Lovett M.D on 01/23/2021 at 2:50 PM  [facility]   CC: Primary care physician; No primary care provider on file.   Note: This dictation was prepared with Dragon dictation along with smaller phrase technology. Any transcriptional errors that result from this process are unintentional.

## 2021-01-26 NOTE — Progress Notes (Signed)
Prn lobetalol given.

## 2021-05-20 ENCOUNTER — Emergency Department: Payer: Medicaid Other

## 2021-05-20 ENCOUNTER — Inpatient Hospital Stay
Admission: EM | Admit: 2021-05-20 | Discharge: 2021-05-28 | DRG: 378 | Disposition: A | Discharge status: Home / Self Care | Payer: Medicaid Other | Attending: Internal Medicine | Admitting: Internal Medicine

## 2021-05-20 ENCOUNTER — Other Ambulatory Visit: Payer: Self-pay

## 2021-05-20 DIAGNOSIS — J449 Chronic obstructive pulmonary disease, unspecified: Secondary | ICD-10-CM | POA: Diagnosis not present

## 2021-05-20 DIAGNOSIS — I5022 Chronic systolic (congestive) heart failure: Secondary | ICD-10-CM

## 2021-05-20 DIAGNOSIS — T45515A Adverse effect of anticoagulants, initial encounter: Secondary | ICD-10-CM | POA: Diagnosis present

## 2021-05-20 DIAGNOSIS — I10 Essential (primary) hypertension: Secondary | ICD-10-CM | POA: Diagnosis present

## 2021-05-20 DIAGNOSIS — K921 Melena: Secondary | ICD-10-CM

## 2021-05-20 DIAGNOSIS — F17210 Nicotine dependence, cigarettes, uncomplicated: Secondary | ICD-10-CM | POA: Diagnosis present

## 2021-05-20 DIAGNOSIS — N950 Postmenopausal bleeding: Secondary | ICD-10-CM | POA: Diagnosis present

## 2021-05-20 DIAGNOSIS — Z20822 Contact with and (suspected) exposure to covid-19: Secondary | ICD-10-CM | POA: Diagnosis present

## 2021-05-20 DIAGNOSIS — D631 Anemia in chronic kidney disease: Secondary | ICD-10-CM | POA: Diagnosis present

## 2021-05-20 DIAGNOSIS — D62 Acute posthemorrhagic anemia: Secondary | ICD-10-CM | POA: Diagnosis not present

## 2021-05-20 DIAGNOSIS — N179 Acute kidney failure, unspecified: Secondary | ICD-10-CM | POA: Diagnosis not present

## 2021-05-20 DIAGNOSIS — K449 Diaphragmatic hernia without obstruction or gangrene: Secondary | ICD-10-CM | POA: Diagnosis present

## 2021-05-20 DIAGNOSIS — D649 Anemia, unspecified: Secondary | ICD-10-CM

## 2021-05-20 DIAGNOSIS — D6832 Hemorrhagic disorder due to extrinsic circulating anticoagulants: Secondary | ICD-10-CM | POA: Diagnosis present

## 2021-05-20 DIAGNOSIS — K5710 Diverticulosis of small intestine without perforation or abscess without bleeding: Secondary | ICD-10-CM | POA: Diagnosis present

## 2021-05-20 DIAGNOSIS — K0889 Other specified disorders of teeth and supporting structures: Secondary | ICD-10-CM | POA: Diagnosis present

## 2021-05-20 DIAGNOSIS — Z7984 Long term (current) use of oral hypoglycemic drugs: Secondary | ICD-10-CM

## 2021-05-20 DIAGNOSIS — K7030 Alcoholic cirrhosis of liver without ascites: Secondary | ICD-10-CM

## 2021-05-20 DIAGNOSIS — K31811 Angiodysplasia of stomach and duodenum with bleeding: Principal | ICD-10-CM | POA: Diagnosis present

## 2021-05-20 DIAGNOSIS — K7460 Unspecified cirrhosis of liver: Secondary | ICD-10-CM | POA: Diagnosis present

## 2021-05-20 DIAGNOSIS — N183 Chronic kidney disease, stage 3 unspecified: Secondary | ICD-10-CM

## 2021-05-20 DIAGNOSIS — B1920 Unspecified viral hepatitis C without hepatic coma: Secondary | ICD-10-CM | POA: Diagnosis present

## 2021-05-20 DIAGNOSIS — N1832 Chronic kidney disease, stage 3b: Secondary | ICD-10-CM | POA: Diagnosis present

## 2021-05-20 DIAGNOSIS — Z8249 Family history of ischemic heart disease and other diseases of the circulatory system: Secondary | ICD-10-CM

## 2021-05-20 DIAGNOSIS — Z7902 Long term (current) use of antithrombotics/antiplatelets: Secondary | ICD-10-CM

## 2021-05-20 DIAGNOSIS — Z7982 Long term (current) use of aspirin: Secondary | ICD-10-CM

## 2021-05-20 DIAGNOSIS — I739 Peripheral vascular disease, unspecified: Secondary | ICD-10-CM

## 2021-05-20 DIAGNOSIS — N189 Chronic kidney disease, unspecified: Secondary | ICD-10-CM

## 2021-05-20 DIAGNOSIS — Z885 Allergy status to narcotic agent status: Secondary | ICD-10-CM

## 2021-05-20 DIAGNOSIS — Z79899 Other long term (current) drug therapy: Secondary | ICD-10-CM

## 2021-05-20 DIAGNOSIS — I429 Cardiomyopathy, unspecified: Secondary | ICD-10-CM | POA: Diagnosis present

## 2021-05-20 DIAGNOSIS — N939 Abnormal uterine and vaginal bleeding, unspecified: Secondary | ICD-10-CM | POA: Diagnosis present

## 2021-05-20 DIAGNOSIS — I130 Hypertensive heart and chronic kidney disease with heart failure and stage 1 through stage 4 chronic kidney disease, or unspecified chronic kidney disease: Secondary | ICD-10-CM | POA: Diagnosis present

## 2021-05-20 DIAGNOSIS — F419 Anxiety disorder, unspecified: Secondary | ICD-10-CM | POA: Diagnosis present

## 2021-05-20 DIAGNOSIS — K219 Gastro-esophageal reflux disease without esophagitis: Secondary | ICD-10-CM | POA: Diagnosis present

## 2021-05-20 DIAGNOSIS — F1290 Cannabis use, unspecified, uncomplicated: Secondary | ICD-10-CM | POA: Diagnosis present

## 2021-05-20 DIAGNOSIS — K635 Polyp of colon: Secondary | ICD-10-CM

## 2021-05-20 LAB — CBC
HCT: 20.3 % — ABNORMAL LOW (ref 36.0–46.0)
Hemoglobin: 6.1 g/dL — ABNORMAL LOW (ref 12.0–15.0)
MCH: 26.3 pg (ref 26.0–34.0)
MCHC: 30 g/dL (ref 30.0–36.0)
MCV: 87.5 fL (ref 80.0–100.0)
Platelets: 170 10*3/uL (ref 150–400)
RBC: 2.32 MIL/uL — ABNORMAL LOW (ref 3.87–5.11)
RDW: 15.4 % (ref 11.5–15.5)
WBC: 5.3 10*3/uL (ref 4.0–10.5)
nRBC: 0 % (ref 0.0–0.2)

## 2021-05-20 LAB — ABO/RH: ABO/RH(D): B POS

## 2021-05-20 LAB — PROTIME-INR
INR: 1.1 (ref 0.8–1.2)
Prothrombin Time: 14.6 seconds (ref 11.4–15.2)

## 2021-05-20 LAB — BASIC METABOLIC PANEL
Anion gap: 5 (ref 5–15)
BUN: 16 mg/dL (ref 6–20)
CO2: 26 mmol/L (ref 22–32)
Calcium: 8.2 mg/dL — ABNORMAL LOW (ref 8.9–10.3)
Chloride: 108 mmol/L (ref 98–111)
Creatinine, Ser: 1.88 mg/dL — ABNORMAL HIGH (ref 0.44–1.00)
GFR, Estimated: 31 mL/min — ABNORMAL LOW (ref >60)
Glucose, Bld: 98 mg/dL (ref 70–99)
Potassium: 3.6 mmol/L (ref 3.5–5.1)
Sodium: 139 mmol/L (ref 135–145)

## 2021-05-20 LAB — RESP PANEL BY RT-PCR (FLU A&B, COVID) ARPGX2
Influenza A by PCR: NEGATIVE
Influenza B by PCR: NEGATIVE
SARS Coronavirus 2 by RT PCR: NEGATIVE

## 2021-05-20 LAB — PREPARE RBC (CROSSMATCH)

## 2021-05-20 LAB — APTT: aPTT: 34 seconds (ref 24–36)

## 2021-05-20 MED ORDER — PANTOPRAZOLE SODIUM 40 MG PO TBEC
40.0000 mg | DELAYED_RELEASE_TABLET | Freq: Every day | ORAL | Status: DC
  Administered 2021-05-21: 40 mg via ORAL
  Filled 2021-05-20: qty 1

## 2021-05-20 MED ORDER — SODIUM CHLORIDE 0.9 % IV SOLN: 10.0000 mL/h | Freq: Once | INTRAVENOUS | Status: AC

## 2021-05-20 MED ORDER — DULOXETINE HCL 30 MG PO CPEP
30.0000 mg | ORAL_CAPSULE | Freq: Every day | ORAL | Status: DC
  Administered 2021-05-21 – 2021-05-28 (×8): 30 mg via ORAL
  Filled 2021-05-20 (×8): qty 1

## 2021-05-20 MED ORDER — PREGABALIN 50 MG PO CAPS
100.0000 mg | ORAL_CAPSULE | Freq: Three times a day (TID) | ORAL | Status: DC
  Administered 2021-05-20 – 2021-05-28 (×23): 100 mg via ORAL
  Filled 2021-05-20 (×23): qty 2

## 2021-05-20 MED ORDER — AMLODIPINE BESYLATE 10 MG PO TABS
10.0000 mg | ORAL_TABLET | Freq: Every day | ORAL | Status: DC
  Administered 2021-05-21 – 2021-05-28 (×8): 10 mg via ORAL
  Filled 2021-05-20 (×8): qty 1

## 2021-05-20 MED ORDER — OXYCODONE-ACETAMINOPHEN 5-325 MG PO TABS
1.0000 | ORAL_TABLET | Freq: Three times a day (TID) | ORAL | Status: DC | PRN
  Administered 2021-05-21 – 2021-05-23 (×6): 1 via ORAL
  Filled 2021-05-20 (×6): qty 1

## 2021-05-20 MED ORDER — TRAZODONE HCL 50 MG PO TABS: 50.0000 mg | ORAL_TABLET | Freq: Every evening | ORAL | Status: DC | PRN

## 2021-05-20 MED ORDER — DICYCLOMINE HCL 10 MG PO CAPS
20.0000 mg | ORAL_CAPSULE | Freq: Once | ORAL | Status: AC
  Administered 2021-05-20: 20 mg via ORAL
  Filled 2021-05-20: qty 2

## 2021-05-20 MED ORDER — GABAPENTIN 100 MG PO CAPS: 100.0000 mg | ORAL_CAPSULE | Freq: Three times a day (TID) | ORAL | Status: DC

## 2021-05-20 MED ORDER — ALBUTEROL SULFATE (2.5 MG/3ML) 0.083% IN NEBU: 3.0000 mL | INHALATION_SOLUTION | Freq: Four times a day (QID) | RESPIRATORY_TRACT | Status: DC | PRN

## 2021-05-20 MED ORDER — CARVEDILOL 25 MG PO TABS
25.0000 mg | ORAL_TABLET | Freq: Two times a day (BID) | ORAL | Status: DC
  Administered 2021-05-21 – 2021-05-28 (×15): 25 mg via ORAL
  Filled 2021-05-20 (×15): qty 1

## 2021-05-20 MED ORDER — ATORVASTATIN CALCIUM 80 MG PO TABS
80.0000 mg | ORAL_TABLET | Freq: Every day | ORAL | Status: DC
  Administered 2021-05-20 – 2021-05-27 (×8): 80 mg via ORAL
  Filled 2021-05-20 (×3): qty 1
  Filled 2021-05-20: qty 4
  Filled 2021-05-20 (×5): qty 1

## 2021-05-20 MED ORDER — ONDANSETRON HCL 4 MG/2ML IJ SOLN
4.0000 mg | Freq: Once | INTRAMUSCULAR | Status: AC
  Administered 2021-05-20: 4 mg via INTRAVENOUS
  Filled 2021-05-20: qty 2

## 2021-05-20 MED ORDER — FENTANYL CITRATE PF 50 MCG/ML IJ SOSY: 50.0000 ug | PREFILLED_SYRINGE | Freq: Once | INTRAMUSCULAR | Status: DC

## 2021-05-20 NOTE — ED Provider Notes (Signed)
[facility] Emergency Department Provider Note   ____________________________________________   I have reviewed the triage vital signs and the nursing notes.   HISTORY  Chief Complaint Vaginal Bleeding   History limited by: Not Limited   HPI Caroline Smith is a 60 y.o. female who presents to the emergency department today with primary concern for vaginal bleeding.  Patient states she first noticed it 2 days ago.  She states that since then she has had fairly constant bleeding.  She has noticed bright red blood as well as some clots.  She states that she last had a period a number of years ago.  In addition the patient is complaining of some lower abdominal pain.  This has been accompanied by some nausea. Patient denies any shortness of breath or weakness.  Records reviewed. Per medical record review patient has a history of arterial disease, on aspirin and plavix. Hx of cirrhosis.   Past Medical History:  Diagnosis Date   Anxiety    Bronchitis    GERD (gastroesophageal reflux disease)    Hypertension     Patient Active Problem List   Diagnosis Date Noted   Uncontrolled hypertension    Dehydration    Generalized weakness    AKI (acute kidney injury) (HCC) 01/19/2021   Non-ischemic cardiomyopathy (HCC) 01/19/2021   Tobacco dependence 01/19/2021   Liver cirrhosis (HCC) 01/19/2021   CKD (chronic kidney disease) stage 3, GFR 30-59 ml/min (HCC) 01/19/2021   Gastroenteritis 01/19/2021   Depression 01/19/2021   Near syncope 01/19/2021    History reviewed. No pertinent surgical history.  Prior to Admission medications   Medication Sig Start Date End Date Taking? Authorizing Provider  acetaminophen (TYLENOL) 500 MG tablet Take 1,000 mg by mouth every 6 (six) hours as needed for mild pain. 06/18/19   [provider]  albuterol (PROVENTIL) (2.5 MG/3ML) 0.083% nebulizer solution Inhale 3 mLs into the lungs every 6 (six) hours as needed for  wheezing. 05/19/20   [provider]  ALLERGY RELIEF 10 MG tablet Take 10 mg by mouth daily. 12/21/20   [provider]  aluminum-magnesium hydroxide-simethicone (MAALOX) 200-200-20 MG/5ML SUSP Take 30 mLs by mouth 4 (four) times daily -  before meals and at bedtime. 12/23/20   Sharman Cheek, MD  amLODipine (NORVASC) 10 MG tablet Take 10 mg by mouth daily.    [provider]  ASPIRIN LOW DOSE 81 MG EC tablet Take 81 mg by mouth daily. 12/27/20   [provider]  atorvastatin (LIPITOR) 80 MG tablet Take 80 mg by mouth at bedtime. 12/27/20   [provider]  carvedilol (COREG) 25 MG tablet Take 1 tablet (25 mg total) by mouth 2 (two) times daily with a meal. 01/21/21   Delfino Lovett, MD  clopidogrel (PLAVIX) 75 MG tablet Take 75 mg by mouth daily. 12/06/20   [provider]  gabapentin (NEURONTIN) 100 MG capsule Take 100 mg by mouth 3 (three) times daily. 12/28/20   [provider]  hydrALAZINE (APRESOLINE) 50 MG tablet Take 1 tablet (50 mg total) by mouth 4 (four) times daily. 01/23/21 02/22/21  Delfino Lovett, MD  isosorbide mononitrate (IMDUR) 60 MG 24 hr tablet Take 1 tablet (60 mg total) by mouth daily. 01/24/21 02/23/21  Delfino Lovett, MD  JARDIANCE 10 MG TABS tablet Take 10 mg by mouth daily. 12/28/20   [provider]  metoCLOPramide (REGLAN) 10 MG tablet Take 1 tablet (10 mg total) by mouth 4 (four) times daily -  before meals and at bedtime. 12/23/20   Sharman Cheek, MD  omeprazole (PRILOSEC) 20 MG capsule Take 20 mg by mouth daily. 12/21/20   [provider]  ondansetron (ZOFRAN) 4 MG tablet Take 4 mg by mouth every 8 (eight) hours as needed for nausea or vomiting.    [provider]  PROAIR HFA 108 [phone] Base) MCG/ACT inhaler Inhale 2 puffs into the lungs every 6 (six) hours as needed for wheezing or shortness of breath. 12/27/20   [provider]  traZODone (DESYREL) 50 MG tablet Take 50 mg by mouth at  bedtime as needed for sleep.    [provider]    Allergies Tramadol  Family History  Problem Relation Age of Onset   Hypertension Mother     Social History Social History   Tobacco Use   Smoking status: Every Day    Types: Cigarettes   Smokeless tobacco: Never  Substance Use Topics   Alcohol use: No   Drug use: No    Review of Systems Constitutional: No fever/chills Eyes: No visual changes. ENT: No sore throat. Cardiovascular: Denies chest pain. Respiratory: Denies shortness of breath. Gastrointestinal: Positive for abdominal pain. Genitourinary: Positive for vaginal bleeding.  Musculoskeletal: Negative for back pain. Skin: Negative for rash. Neurological: Negative for headaches, focal weakness or numbness.  ____________________________________________   PHYSICAL EXAM:  VITAL SIGNS: ED Triage Vitals  Enc Vitals Group     BP 05/20/21 1528 (!) 162/79     Pulse Rate 05/20/21 1526 75     Resp 05/20/21 1526 18     Temp 05/20/21 1526 98.3 F (36.8 C)     Temp Source 05/20/21 1526 Oral     SpO2 05/20/21 1526 100 %     Weight 05/20/21 1526 114 lb (51.7 kg)     Height 05/20/21 1526 5\' 7"  (1.702 m)     Head Circumference --      Peak Flow --      Pain Score 05/20/21 1526 10   Constitutional: Alert and oriented.  Eyes: Conjunctivae are normal.  ENT      Head: Normocephalic and atraumatic.      Nose: No congestion/rhinnorhea.      Mouth/Throat: Mucous membranes are moist.      Neck: No stridor. Hematological/Lymphatic/Immunilogical: No cervical lymphadenopathy. Cardiovascular: Normal rate, regular rhythm.  No murmurs, rubs, or gallops.  Respiratory: Normal respiratory effort without tachypnea nor retractions. Breath sounds are clear and equal bilaterally. No wheezes/rales/rhonchi. Gastrointestinal: Soft and diffusely tender to palpation. Genitourinary: Deferred Musculoskeletal: Normal range of motion in all extremities. No lower extremity  edema. Neurologic:  Normal speech and language. No gross focal neurologic deficits are appreciated.  Skin:  Skin is warm, dry and intact. No rash noted. Psychiatric: Mood and affect are normal. Speech and behavior are normal. Patient exhibits appropriate insight and judgment.  ____________________________________________    LABS (pertinent positives/negatives)  CBC wbc 5.3, hgb 6.1, plt 170 BMP wnl except cr 1.88, ca 8.2 INR 1.1 COVID negative ____________________________________________   EKG  None  ____________________________________________    RADIOLOGY  07/20/21 pelvis Simple free fluid in pelvis. Trace free fluid in endometrial cavity.  ____________________________________________   PROCEDURES  Procedures  CRITICAL CARE Performed by: [place]   Total critical care time: 30 minutes  Critical care time was exclusive of separately billable procedures and treating other patients.  Critical care was necessary to treat or prevent imminent or life-threatening deterioration.  Critical care was time spent personally by me  on the following activities: development of treatment plan with patient and/or surrogate as well as nursing, discussions with consultants, evaluation of patient's response to treatment, examination of patient, obtaining history from patient or surrogate, ordering and performing treatments and interventions, ordering and review of laboratory studies, ordering and review of radiographic studies, pulse oximetry and re-evaluation of patient's condition.  ____________________________________________   INITIAL IMPRESSION / ASSESSMENT AND PLAN / ED COURSE  Pertinent labs & imaging results that were available during my care of the patient were reviewed by me and considered in my medical decision making (see chart for details).   Patient presented to the emergency department today because of concerns for vaginal bleeding that started 2 days ago.  Blood  work here does show hemoglobin of 6.1.  Because of this I did consent and discussed with the patient for blood transfusion.  Ultrasound was performed without any clear etiology of the patient's bleeding.  Will plan on admission for further work-up and evaluation.  ____________________________________________   FINAL CLINICAL IMPRESSION(S) / ED DIAGNOSES  Final diagnoses:  Vaginal bleeding  Anemia, unspecified type     Note: This dictation was prepared with Dragon dictation. Any transcriptional errors that result from this process are unintentional     Phineas Semen, MD 05/20/21 2113

## 2021-05-20 NOTE — ED Triage Notes (Signed)
Pt comes ems from home with vaginal bleeding. Hasn't had menstrual cycle since her early 22s. Today was large amount of cramping and bleeding with clots. VSS.

## 2021-05-20 NOTE — ED Notes (Signed)
Patient resting quietly denies any complaint at this time.  Patient is requesting if she can have something to eat, MD with be advised.

## 2021-05-20 NOTE — H&P (Signed)
History and Physical    Caroline Smith SKA:[id number] DOB: 10/09/1960 DOA: 05/20/2021  PCP: System, Provider Not In  Patient coming from: Home  I have personally briefly reviewed patient's old medical records in [facility]  Chief Complaint: bleeding  HPI: Caroline Smith is a 60 y.o. female with medical history significant for PVD, PAD, hypertension, cardiomyopathy with EF of 25%, asthma/COPD, cirrhosis with hepatitis C, history of GI bleed, CKD stage III, polysubstance abuse who presents with concerns of bleeding.  Patient reports having free-flowing vaginal bleeding for the past 2 days.  Has been using 3-4 pads daily. Last menstrual was when she was 60yo. Upon further questioning she is not sure if source of bleeding is vaginal or rectal   States last bowel movement was about 2 days ago and did not have blood when she wiped but noticed stool was more dark.  Notes diffuse abdominal pain.  Has nausea but no vomiting.  Denies any diarrhea. She does have a history of suspected GI bleed back in 2020 when she was on aspirin, Plavix and pletal. EGD at that time did not reveal source of bleeding.  Capsule study with nonbleeding AVMs.  Liver ultrasound negative for portal vein thrombus.  At that time her antiplatelets were stopped due to high risk of bleeding.    Thinks she has some weight loss.  No personal or family history of cancer that she is aware of.  Patient reports tobacco and marijuana use.  Denies alcohol use.  ED Course: She was afebrile, hypertensive up to systolic of 160s with hemoglobin of 6.1 from a prior of around 11.  No leukocytosis.  Sodium 139, potassium 3.6, creatinine of 1.88 from a prior of 1.57 with unclear baseline.  CBG of 84.  Pelvic and transvaginal ultrasound shows trace simple appearing fluid within the endometrial cavity and normal endometrial thickness  Review of Systems: Constitutional: maybe some Weight Change, No Fever ENT/Mouth: No sore throat,  No Rhinorrhea Eyes: No Vision Changes Cardiovascular: No Chest Pain, no SOB,No Edema Respiratory: No Cough  Gastrointestinal: + Nausea, No Vomiting, No Diarrhea, No Constipation, + Pain Genitourinary: no Urinary Incontinence Musculoskeletal: No Arthralgias, No Myalgias Skin: No Skin Lesions, No Pruritus, Neuro: no Weakness, No Numbness Psych: No Anxiety/Panic, No Depression, no decrease appetite Heme/Lymph: No Bruising, No Bleeding  Past Medical History:  Diagnosis Date   Anxiety    Bronchitis    GERD (gastroesophageal reflux disease)    Hypertension     History reviewed. No pertinent surgical history.   reports that she has been smoking cigarettes. She has never used smokeless tobacco. She reports that she does not drink alcohol and does not use drugs. Social History  Allergies  Allergen Reactions   Tramadol Other (See Comments) and Itching    Reaction:  Gave pt anxiety  "nervous feeling" Makes her nervous. Denied itching, rash, swelling, or difficulty breathing when she took this medicine.  07/31/19 states took 3 weeks ago did not cause itching or making her nervous it just did not help Reaction:  Gave pt anxiety  Makes her nervous "nervous feeling" Makes her nervous. Denied itching, rash, swelling, or difficulty breathing when she took this medicine.  07/31/19 states took 3 weeks ago did not cause itching or making her nervous it just did not help     Family History  Problem Relation Age of Onset   Hypertension Mother      Prior to Admission medications   Medication Sig Start Date End  Date Taking? Authorizing Provider  acetaminophen (TYLENOL) 500 MG tablet Take 1,000 mg by mouth every 6 (six) hours as needed for mild pain. 06/18/19  Yes [provider]  albuterol (PROVENTIL) (2.5 MG/3ML) 0.083% nebulizer solution Inhale 3 mLs into the lungs every 6 (six) hours as needed for wheezing. 05/19/20  Yes [provider]  ALLERGY RELIEF 10 MG tablet  Take 10 mg by mouth daily. 12/21/20  Yes [provider]  aluminum-magnesium hydroxide-simethicone (MAALOX) 200-200-20 MG/5ML SUSP Take 30 mLs by mouth 4 (four) times daily -  before meals and at bedtime. 12/23/20  Yes Sharman Cheek, MD  amLODipine (NORVASC) 10 MG tablet Take 10 mg by mouth daily.   Yes [provider]  ASPIRIN LOW DOSE 81 MG EC tablet Take 81 mg by mouth daily. 12/27/20  Yes [provider]  atorvastatin (LIPITOR) 80 MG tablet Take 80 mg by mouth at bedtime. 12/27/20  Yes [provider]  carvedilol (COREG) 25 MG tablet Take 1 tablet (25 mg total) by mouth 2 (two) times daily with a meal. 01/21/21  Yes Delfino Lovett, MD  clopidogrel (PLAVIX) 75 MG tablet Take 75 mg by mouth daily. 12/06/20  Yes [provider]  DULoxetine (CYMBALTA) 30 MG capsule Take 30 mg by mouth daily. 04/14/21  Yes [provider]  furosemide (LASIX) 40 MG tablet Take 40 mg by mouth daily as needed. 03/18/21  Yes [provider]  gabapentin (NEURONTIN) 100 MG capsule Take 100 mg by mouth 3 (three) times daily. 12/28/20  Yes [provider]  JARDIANCE 10 MG TABS tablet Take 10 mg by mouth daily. 12/28/20  Yes [provider]  lisinopril (ZESTRIL) 20 MG tablet Take 20 mg by mouth daily. 04/14/21  Yes [provider]  omeprazole (PRILOSEC) 20 MG capsule Take 20 mg by mouth daily. 12/21/20  Yes [provider]  ondansetron (ZOFRAN) 4 MG tablet Take 4 mg by mouth every 8 (eight) hours as needed for nausea or vomiting.   Yes [provider]  oxyCODONE-acetaminophen (PERCOCET/ROXICET) 5-325 MG tablet Take 1 tablet by mouth every 8 (eight) hours as needed. 04/30/21  Yes [provider]  pregabalin (LYRICA) 100 MG capsule Take 100 mg by mouth 3 (three) times daily. 04/14/21  Yes [provider]  PROAIR HFA 108 (90 Base) MCG/ACT inhaler Inhale 2 puffs into the lungs every 6 (six) hours as needed for wheezing or  shortness of breath. 12/27/20  Yes [provider]  traZODone (DESYREL) 50 MG tablet Take 50 mg by mouth at bedtime as needed for sleep.   Yes [provider]    Physical Exam: Vitals:   05/20/21 1843 05/20/21 1915 05/20/21 2012 05/20/21 2044  BP:  (!) 172/78 (!) 171/76 (!) 159/99  Pulse: 75 71 68 70  Resp:   18 17  Temp:   98.1 F (36.7 C) 98 F (36.7 C)  TempSrc:   Oral Oral  SpO2: 100% 100% 100% 99%  Weight:      Height:        Constitutional: Thin cachectic appearing female appearing much older than stated age lying curled up uncomfortable in bed Vitals:   05/20/21 1843 05/20/21 1915 05/20/21 2012 05/20/21 2044  BP:  (!) 172/78 (!) 171/76 (!) 159/99  Pulse: 75 71 68 70  Resp:   18 17  Temp:   98.1 F (36.7 C) 98 F (36.7 C)  TempSrc:   Oral Oral  SpO2: 100% 100% 100% 99%  Weight:  Height:       Eyes: PERRL, lids and conjunctivae normal ENMT: Mucous membranes are moist.  Poor dentition throughout Neck: normal, supple Respiratory: clear to auscultation bilaterally, no wheezing, no crackles. Normal respiratory effort. No accessory muscle use.  Cardiovascular: Regular rate and rhythm, no murmurs / rubs / gallops. No extremity edema. 2+ pedal pulses.  Abdomen: Soft, nondistended, diffuse tenderness without any guarding, rigidity or rebound tenderness, no masses palpated.  Genitourinary: Bright red blood in both of rectal and vaginal region Musculoskeletal: no clubbing / cyanosis. No joint deformity upper and lower extremities. Good ROM, no contractures. Normal muscle tone.  Skin: no rashes, lesions, ulcers. No induration Neurologic: CN 2-12 grossly intact. Sensation intact,  Strength 5/5 in all 4.  Psychiatric: Normal judgment and insight. Alert and oriented x 3. Normal mood.     Labs on Admission: I have personally reviewed following labs and imaging studies  CBC: Recent Labs  Lab 05/20/21 1529  WBC 5.3  HGB 6.1*  HCT 20.3*  MCV 87.5  PLT  170   Basic Metabolic Panel: Recent Labs  Lab 05/20/21 1529  NA 139  K 3.6  CL 108  CO2 26  GLUCOSE 98  BUN 16  CREATININE 1.88*  CALCIUM 8.2*   GFR: Estimated Creatinine Clearance: 26.6 mL/min (A) (by C-G formula based on SCr of 1.88 mg/dL (H)). Liver Function Tests: No results for input(s): AST, ALT, ALKPHOS, BILITOT, PROT, ALBUMIN in the last 168 hours. No results for input(s): LIPASE, AMYLASE in the last 168 hours. No results for input(s): AMMONIA in the last 168 hours. Coagulation Profile: Recent Labs  Lab 05/20/21 1825  INR 1.1   Cardiac Enzymes: No results for input(s): CKTOTAL, CKMB, CKMBINDEX, TROPONINI in the last 168 hours. BNP (last 3 results) No results for input(s): PROBNP in the last 8760 hours. HbA1C: No results for input(s): HGBA1C in the last 72 hours. CBG: No results for input(s): GLUCAP in the last 168 hours. Lipid Profile: No results for input(s): CHOL, HDL, LDLCALC, TRIG, CHOLHDL, LDLDIRECT in the last 72 hours. Thyroid Function Tests: No results for input(s): TSH, T4TOTAL, FREET4, T3FREE, THYROIDAB in the last 72 hours. Anemia Panel: No results for input(s): VITAMINB12, FOLATE, FERRITIN, TIBC, IRON, RETICCTPCT in the last 72 hours. Urine analysis:    Component Value Date/Time   COLORURINE YELLOW (A) 01/19/2021 1437   APPEARANCEUR HAZY (A) 01/19/2021 1437   LABSPEC 1.012 01/19/2021 1437   PHURINE 5.0 01/19/2021 1437   GLUCOSEU 50 (A) 01/19/2021 1437   HGBUR NEGATIVE 01/19/2021 1437   BILIRUBINUR NEGATIVE 01/19/2021 1437   KETONESUR NEGATIVE 01/19/2021 1437   PROTEINUR 100 (A) 01/19/2021 1437   NITRITE NEGATIVE 01/19/2021 1437   LEUKOCYTESUR SMALL (A) 01/19/2021 1437    Radiological Exams on Admission: US PELVIC COMPLETE WITH TRANSVAGINAL  Result Date: 05/20/2021 CLINICAL DATA:  Postmenopausal vaginal bleeding EXAM: TRANSABDOMINAL AND TRANSVAGINAL ULTRASOUND OF PELVIS TECHNIQUE: Both transabdominal and transvaginal ultrasound  examinations of the pelvis were performed. Transabdominal technique was performed for global imaging of the pelvis including uterus, ovaries, adnexal regions, and pelvic cul-de-sac. It was necessary to proceed with endovaginal exam following the transabdominal exam to visualize the endometrium and ovaries bilaterally. COMPARISON:  None FINDINGS: Uterus Measurements: 4.5 x 3.0 x 4.1 cm = volume: 29 mL. No fibroids or other mass visualized. Endometrium Thickness: 3 mm. There is small simple appearing free fluid within the endometrial cavity measuring up to 2 mm Right ovary Not visualized.  No adnexal mass. Left ovary Not visualized,  no adnexal mass. Other findings There is moderate simple appearing fluid within the pelvis, nonspecific. IMPRESSION: Moderate simple appearing free fluid within the pelvis. Nonvisualization of the ovaries. Trace simple appearing fluid within the endometrial cavity measuring up to 2 mm. Normal endometrial thickness. Electronically Signed   By: Helyn Numbers M.D.   On: 05/20/2021 20:18      Assessment/Plan  Acute blood loss anemia  -Hgb of 6.1 from baseline around 11.  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  Last required transfusion back in April 2022 due to suspected bleeding AVMs and was transition to Plavix monotherapy. -Transfuse 1u pRBC and repeat CBC -Questionable whether bleeding is really vaginal and I suspect with her hx this is more GI source.  FOBT will not useful since she has bright red blood throughout her rectal and vaginal area.  Discussed transvaginal ultrasound finding with radiology and simple fluid seen not convincing for blood product and there is no endometrial thickening. Unable to obtain a CTA of the abdomen and pelvis due to worsening renal function. -recommend GI consult in the morning for potential colonoscopy  PAD  -hx of femoral endartectomy on Plavix due to hx of AVM bleed. Hold  plavix   AKI on CKD -creatinine of 1.88 from 1.57 with unclear baseline  -follow repeat creatinine in the morning after transfusion  Hypertension Continue amlodipine, hold lisinopril due to AKI  HFrEF -last echo in 11/2020 with LV 25%.  Monitor fluid status closely with transfusion -Continue Coreg, atorvastatin  Cirrhosis with history of treated hepatitis C - No liver decompensation on admission  COPD  -Continue PRN bronchodilator  DVT prophylaxis:SCDs Code Status: Full Family Communication: Plan discussed with patient at bedside  disposition Plan: Home with observation Consults called:  Admission status: Observation  Level of care: Progressive Cardiac  Status is: Observation  The patient remains OBS appropriate and will d/c before 2 midnights.  Dispo: The patient is from: Home              Anticipated d/c is to: Home              Patient currently is not medically stable to d/c.   Difficult to place patient No         Anselm Jungling DO [facility]   If 7PM-7AM, please contact night-coverage [email or web address]   05/20/2021, 9:53 PM

## 2021-05-20 NOTE — ED Notes (Signed)
While assisting another patient in triage, This RN witnessed Caroline Smith take herself out of the hospital wheelchair and lay herself on the floor in triage. I informed the patient that she should not lay in the floor, with the caution that the floor may not be sanitary. She reported that she just could not sit in a chair so a recliner was offered to the patient. This RN and another staff member assisted the patient off of the potentially unsanitary ED floor. No injury noted.

## 2021-05-20 NOTE — ED Notes (Signed)
Patient given ginger ale. 

## 2021-05-20 NOTE — ED Notes (Signed)
Lab contacted to send phlebotomist for re-collect on type and screen

## 2021-05-21 ENCOUNTER — Encounter: Payer: Self-pay | Admitting: Family Medicine

## 2021-05-21 DIAGNOSIS — F1290 Cannabis use, unspecified, uncomplicated: Secondary | ICD-10-CM | POA: Diagnosis present

## 2021-05-21 DIAGNOSIS — D62 Acute posthemorrhagic anemia: Secondary | ICD-10-CM | POA: Diagnosis present

## 2021-05-21 DIAGNOSIS — K31811 Angiodysplasia of stomach and duodenum with bleeding: Secondary | ICD-10-CM | POA: Diagnosis present

## 2021-05-21 DIAGNOSIS — I739 Peripheral vascular disease, unspecified: Secondary | ICD-10-CM | POA: Diagnosis present

## 2021-05-21 DIAGNOSIS — K625 Hemorrhage of anus and rectum: Secondary | ICD-10-CM | POA: Diagnosis not present

## 2021-05-21 DIAGNOSIS — K7460 Unspecified cirrhosis of liver: Secondary | ICD-10-CM | POA: Diagnosis not present

## 2021-05-21 DIAGNOSIS — Z20822 Contact with and (suspected) exposure to covid-19: Secondary | ICD-10-CM | POA: Diagnosis present

## 2021-05-21 DIAGNOSIS — D631 Anemia in chronic kidney disease: Secondary | ICD-10-CM | POA: Diagnosis present

## 2021-05-21 DIAGNOSIS — Z8249 Family history of ischemic heart disease and other diseases of the circulatory system: Secondary | ICD-10-CM | POA: Diagnosis not present

## 2021-05-21 DIAGNOSIS — K5710 Diverticulosis of small intestine without perforation or abscess without bleeding: Secondary | ICD-10-CM | POA: Diagnosis present

## 2021-05-21 DIAGNOSIS — K921 Melena: Secondary | ICD-10-CM | POA: Diagnosis present

## 2021-05-21 DIAGNOSIS — K31819 Angiodysplasia of stomach and duodenum without bleeding: Secondary | ICD-10-CM | POA: Diagnosis not present

## 2021-05-21 DIAGNOSIS — K449 Diaphragmatic hernia without obstruction or gangrene: Secondary | ICD-10-CM | POA: Diagnosis present

## 2021-05-21 DIAGNOSIS — J449 Chronic obstructive pulmonary disease, unspecified: Secondary | ICD-10-CM | POA: Diagnosis present

## 2021-05-21 DIAGNOSIS — N939 Abnormal uterine and vaginal bleeding, unspecified: Secondary | ICD-10-CM | POA: Diagnosis not present

## 2021-05-21 DIAGNOSIS — F419 Anxiety disorder, unspecified: Secondary | ICD-10-CM | POA: Diagnosis present

## 2021-05-21 DIAGNOSIS — N179 Acute kidney failure, unspecified: Secondary | ICD-10-CM | POA: Diagnosis present

## 2021-05-21 DIAGNOSIS — I429 Cardiomyopathy, unspecified: Secondary | ICD-10-CM | POA: Diagnosis present

## 2021-05-21 DIAGNOSIS — I5022 Chronic systolic (congestive) heart failure: Secondary | ICD-10-CM | POA: Diagnosis present

## 2021-05-21 DIAGNOSIS — D6832 Hemorrhagic disorder due to extrinsic circulating anticoagulants: Secondary | ICD-10-CM | POA: Diagnosis present

## 2021-05-21 DIAGNOSIS — K319 Disease of stomach and duodenum, unspecified: Secondary | ICD-10-CM | POA: Diagnosis not present

## 2021-05-21 DIAGNOSIS — N950 Postmenopausal bleeding: Secondary | ICD-10-CM | POA: Diagnosis present

## 2021-05-21 DIAGNOSIS — D509 Iron deficiency anemia, unspecified: Secondary | ICD-10-CM | POA: Diagnosis not present

## 2021-05-21 DIAGNOSIS — B1920 Unspecified viral hepatitis C without hepatic coma: Secondary | ICD-10-CM | POA: Diagnosis present

## 2021-05-21 DIAGNOSIS — I130 Hypertensive heart and chronic kidney disease with heart failure and stage 1 through stage 4 chronic kidney disease, or unspecified chronic kidney disease: Secondary | ICD-10-CM | POA: Diagnosis present

## 2021-05-21 DIAGNOSIS — K0889 Other specified disorders of teeth and supporting structures: Secondary | ICD-10-CM | POA: Diagnosis present

## 2021-05-21 DIAGNOSIS — N1832 Chronic kidney disease, stage 3b: Secondary | ICD-10-CM | POA: Diagnosis present

## 2021-05-21 DIAGNOSIS — K635 Polyp of colon: Secondary | ICD-10-CM | POA: Diagnosis present

## 2021-05-21 DIAGNOSIS — F17210 Nicotine dependence, cigarettes, uncomplicated: Secondary | ICD-10-CM | POA: Diagnosis present

## 2021-05-21 LAB — CBC
HCT: 24 % — ABNORMAL LOW (ref 36.0–46.0)
Hemoglobin: 7.7 g/dL — ABNORMAL LOW (ref 12.0–15.0)
MCH: 27.6 pg (ref 26.0–34.0)
MCHC: 32.1 g/dL (ref 30.0–36.0)
MCV: 86 fL (ref 80.0–100.0)
Platelets: 236 10*3/uL (ref 150–400)
RBC: 2.79 MIL/uL — ABNORMAL LOW (ref 3.87–5.11)
RDW: 14.5 % (ref 11.5–15.5)
WBC: 6.1 10*3/uL (ref 4.0–10.5)
nRBC: 0 % (ref 0.0–0.2)

## 2021-05-21 LAB — COMPREHENSIVE METABOLIC PANEL
ALT: 10 U/L (ref 0–44)
AST: 20 U/L (ref 15–41)
Albumin: 2.6 g/dL — ABNORMAL LOW (ref 3.5–5.0)
Alkaline Phosphatase: 119 U/L (ref 38–126)
Anion gap: 7 (ref 5–15)
BUN: 16 mg/dL (ref 6–20)
CO2: 24 mmol/L (ref 22–32)
Calcium: 8.7 mg/dL — ABNORMAL LOW (ref 8.9–10.3)
Chloride: 109 mmol/L (ref 98–111)
Creatinine, Ser: 2.03 mg/dL — ABNORMAL HIGH (ref 0.44–1.00)
GFR, Estimated: 28 mL/min — ABNORMAL LOW (ref >60)
Glucose, Bld: 83 mg/dL (ref 70–99)
Potassium: 3.5 mmol/L (ref 3.5–5.1)
Sodium: 140 mmol/L (ref 135–145)
Total Bilirubin: 0.5 mg/dL (ref 0.3–1.2)
Total Protein: 6.4 g/dL — ABNORMAL LOW (ref 6.5–8.1)

## 2021-05-21 LAB — HEMOGLOBIN
Hemoglobin: 7.8 g/dL — ABNORMAL LOW (ref 12.0–15.0)
Hemoglobin: 10 g/dL — ABNORMAL LOW (ref 12.0–15.0)

## 2021-05-21 LAB — PREPARE RBC (CROSSMATCH)

## 2021-05-21 MED ORDER — HYDRALAZINE HCL 20 MG/ML IJ SOLN
10.0000 mg | INTRAMUSCULAR | Status: DC | PRN
  Administered 2021-05-22 (×3): 10 mg via INTRAVENOUS
  Filled 2021-05-21 (×3): qty 1

## 2021-05-21 MED ORDER — SODIUM CHLORIDE 0.9 % IV SOLN
2.0000 g | INTRAVENOUS | Status: DC
  Administered 2021-05-21 – 2021-05-27 (×5): 2 g via INTRAVENOUS
  Filled 2021-05-21: qty 2
  Filled 2021-05-21 (×2): qty 20
  Filled 2021-05-21 (×3): qty 2
  Filled 2021-05-21 (×3): qty 20

## 2021-05-21 MED ORDER — DICYCLOMINE HCL 20 MG PO TABS
20.0000 mg | ORAL_TABLET | Freq: Three times a day (TID) | ORAL | Status: DC
  Administered 2021-05-21 – 2021-05-28 (×24): 20 mg via ORAL
  Filled 2021-05-21 (×31): qty 1

## 2021-05-21 MED ORDER — SODIUM CHLORIDE 0.9% FLUSH
10.0000 mL | INTRAVENOUS | Status: DC | PRN
  Administered 2021-05-27: 10 mL

## 2021-05-21 MED ORDER — METOPROLOL TARTRATE 5 MG/5ML IV SOLN
5.0000 mg | Freq: Once | INTRAVENOUS | Status: AC
  Administered 2021-05-21: 5 mg via INTRAVENOUS
  Filled 2021-05-21: qty 5

## 2021-05-21 MED ORDER — PANTOPRAZOLE SODIUM 40 MG IV SOLR
40.0000 mg | Freq: Two times a day (BID) | INTRAVENOUS | Status: DC
  Administered 2021-05-21 – 2021-05-28 (×15): 40 mg via INTRAVENOUS
  Filled 2021-05-21 (×15): qty 40

## 2021-05-21 MED ORDER — OCTREOTIDE ACETATE 500 MCG/ML IJ SOLN
50.0000 ug/h | INTRAMUSCULAR | Status: DC
  Administered 2021-05-21 – 2021-05-27 (×11): 50 ug/h via INTRAVENOUS
  Filled 2021-05-21 (×24): qty 1

## 2021-05-21 MED ORDER — HYDRALAZINE HCL 50 MG PO TABS
50.0000 mg | ORAL_TABLET | Freq: Three times a day (TID) | ORAL | Status: DC
  Administered 2021-05-21 – 2021-05-28 (×20): 50 mg via ORAL
  Filled 2021-05-21 (×19): qty 1

## 2021-05-21 MED ORDER — OCTREOTIDE LOAD VIA INFUSION
50.0000 ug | Freq: Once | INTRAVENOUS | Status: AC
  Administered 2021-05-21: 50 ug via INTRAVENOUS
  Filled 2021-05-21: qty 25

## 2021-05-21 MED ORDER — SODIUM CHLORIDE 0.9% IV SOLUTION: Freq: Once | INTRAVENOUS | Status: AC

## 2021-05-21 NOTE — Progress Notes (Signed)
PROGRESS NOTE    Caroline Smith  FUX:[id number] DOB: [birth year] DOA: 05/20/2021 PCP: System, Provider Not In    Brief Narrative:  60 y.o. female with medical history significant for PVD, PAD, hypertension, cardiomyopathy with EF of 25%, asthma/COPD, cirrhosis with hepatitis C, history of GI bleed, CKD stage III, polysubstance abuse who presents with concerns of bleeding.  Patient reports having free-flowing vaginal bleeding for the past 2 days.  Has been using 3-4 pads daily. Last menstrual was when she was 60yo. Upon further questioning she is not sure if source of bleeding is vaginal or rectal   States last bowel movement was about 2 days ago and did not have blood when she wiped but noticed stool was more dark.  Notes diffuse abdominal pain.  Has nausea but no vomiting.  Denies any diarrhea. She does have a history of suspected GI bleed back in 2020 when she was on aspirin, Plavix and pletal. EGD at that time did not reveal source of bleeding.  Capsule study with nonbleeding AVMs.  Liver ultrasound negative for portal vein thrombus.  At that time her antiplatelets were stopped due to high risk of bleeding.    Patient received 1 unit packed red blood cells for hemoglobin 6.1.  Hemoglobin responded to 7.7.  Patient unclear whether the bleeding may be vaginal or rectal.  Vaginal ultrasound not indicative vaginal bleed.  GI consulted on 10/8.   Assessment & Plan:   Principal Problem:   Acute blood loss anemia Active Problems:   Liver cirrhosis (HCC)   Uncontrolled hypertension   PAD (peripheral artery disease) (HCC)   Acute-on-chronic kidney injury (HCC)   Chronic HFrEF (heart failure with reduced ejection fraction) (HCC)   COPD (chronic obstructive pulmonary disease) (HCC)   Acute on chronic blood loss anemia  Hgb of 6.1 from baseline around 11.   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   Last required transfusion  back in April 2022 due to suspected bleeding AVMs and was transition to Plavix monotherapy. Transfuse 1 unit PRBC on admission for hemoglobin 6.1 Hemoglobin responded appropriately Still unclear whether the bleeding is vaginal or rectal Patient does not have periods Transvaginal ultrasound with simple fluid not convincing for blood, no endometrial thickening CT abdomen pelvis not possible due to renal insufficiency Plan: Transfuse additional unit PRBC Goal hemoglobin 8 GI consulted Twice daily PPI Trend hemoglobin every 8 hours Continue holding Plavix Will likely need Plavix washout prior to any endoscopic procedure   PAD  -hx of femoral endartectomy on Plavix due to hx of AVM bleed.  Hold plavix    AKI on CKD stage IIIb -Creatinine progressively worsening -Baseline creatinine 1.57 -Avoid nephrotoxins   Hypertension Blood pressure remains elevated Continue Norvasc 10 mg daily Continue Coreg 25 mg twice daily Add hydralazine 50 mg 3 times daily   HFrEF -last echo in 11/2020 with LV 25%.   - Monitor fluid status closely with transfusion -Continue Coreg, atorvastatin   Cirrhosis with history of treated hepatitis C - No liver decompensation on admission   COPD  -Continue PRN bronchodilator  DVT prophylaxis: SCD Code Status: Full Family Communication: None today Disposition Plan: Status is: Observation  The patient will require care spanning > 2 midnights and should be moved to inpatient because: Inpatient level of care appropriate due to severity of illness  Dispo: The patient is from: Home  Anticipated d/c is to: Home              Patient currently is not medically stable to d/c.   Difficult to place patient No       Level of care: Progressive Cardiac  Consultants:  GI  Procedures:  None  Antimicrobials: None   Subjective: Seen and examined.  Endorses abdominal pain.  Objective: Vitals:   05/21/21 0241 05/21/21 0437 05/21/21 0738  05/21/21 1049  BP: (!) 187/88 (!) 160/81 (!) 172/94 133/87  Pulse: 71 67 70 67  Resp: 20 18 16 16   Temp: (!) 97.4 F (36.3 C) 97.7 F (36.5 C) 97.9 F (36.6 C) 97.7 F (36.5 C)  TempSrc: Oral Oral  Axillary  SpO2: 100% 97% 99% 100%  Weight:      Height:        Intake/Output Summary (Last 24 hours) at 05/21/2021 1054 Last data filed at 05/21/2021 0930 Gross per 24 hour  Intake 500 ml  Output --  Net 500 ml   Filed Weights   05/20/21 1526 05/21/21 0241  Weight: 51.7 kg 54.7 kg    Examination:  General exam: No acute distress Respiratory system: Lungs clear.  Normal work of breathing.  Room air Cardiovascular system: S1-S2, RRR, no murmurs, no pedal edema Gastrointestinal system: Soft, nondistended, positive bowel sounds, mild tender to palpation Central nervous system: Alert and oriented. No focal neurological deficits. Extremities: Symmetric 5 x 5 power. Skin: No rashes, lesions or ulcers Psychiatry: Judgement and insight appear normal. Mood & affect appropriate.     Data Reviewed: I have personally reviewed following labs and imaging studies  CBC: Recent Labs  Lab 05/20/21 1529 05/21/21 0703 05/21/21 0854  WBC 5.3 6.1  --   HGB 6.1* 7.7* 7.8*  HCT 20.3* 24.0*  --   MCV 87.5 86.0  --   PLT 170 236  --    Basic Metabolic Panel: Recent Labs  Lab 05/20/21 1529 05/21/21 0703  NA 139 140  K 3.6 3.5  CL 108 109  CO2 26 24  GLUCOSE 98 83  BUN 16 16  CREATININE 1.88* 2.03*  CALCIUM 8.2* 8.7*   GFR: Estimated Creatinine Clearance: 26.1 mL/min (A) (by C-G formula based on SCr of 2.03 mg/dL (H)). Liver Function Tests: Recent Labs  Lab 05/21/21 0703  AST 20  ALT 10  ALKPHOS 119  BILITOT 0.5  PROT 6.4*  ALBUMIN 2.6*   No results for input(s): LIPASE, AMYLASE in the last 168 hours. No results for input(s): AMMONIA in the last 168 hours. Coagulation Profile: Recent Labs  Lab 05/20/21 1825  INR 1.1   Cardiac Enzymes: No results for input(s):  CKTOTAL, CKMB, CKMBINDEX, TROPONINI in the last 168 hours. BNP (last 3 results) No results for input(s): PROBNP in the last 8760 hours. HbA1C: No results for input(s): HGBA1C in the last 72 hours. CBG: No results for input(s): GLUCAP in the last 168 hours. Lipid Profile: No results for input(s): CHOL, HDL, LDLCALC, TRIG, CHOLHDL, LDLDIRECT in the last 72 hours. Thyroid Function Tests: No results for input(s): TSH, T4TOTAL, FREET4, T3FREE, THYROIDAB in the last 72 hours. Anemia Panel: No results for input(s): VITAMINB12, FOLATE, FERRITIN, TIBC, IRON, RETICCTPCT in the last 72 hours. Sepsis Labs: No results for input(s): PROCALCITON, LATICACIDVEN in the last 168 hours.  Recent Results (from the past 240 hour(s))  Resp Panel by RT-PCR (Flu A&B, Covid) Nasopharyngeal Swab     Status: None   Collection Time: 05/20/21  6:17 PM  Specimen: Nasopharyngeal Swab; Nasopharyngeal(NP) swabs in vial transport medium  Result Value Ref Range Status   SARS Coronavirus 2 by RT PCR NEGATIVE NEGATIVE Final    Comment: (NOTE) SARS-CoV-2 target nucleic acids are NOT DETECTED.  The SARS-CoV-2 RNA is generally detectable in upper respiratory specimens during the acute phase of infection. The lowest concentration of SARS-CoV-2 viral copies this assay can detect is 138 copies/mL. A negative result does not preclude SARS-Cov-2 infection and should not be used as the sole basis for treatment or other patient management decisions. A negative result may occur with  improper specimen collection/handling, submission of specimen other than nasopharyngeal swab, presence of viral mutation(s) within the areas targeted by this assay, and inadequate number of viral copies(<138 copies/mL). A negative result must be combined with clinical observations, patient history, and epidemiological information. The expected result is Negative.  Fact Sheet for Patients:  BloggerCourse.com  Fact Sheet  for Healthcare Providers:  SeriousBroker.it  This test is no t yet approved or cleared by the [place] FDA and  has been authorized for detection and/or diagnosis of SARS-CoV-2 by FDA under an Emergency Use Authorization (EUA). This EUA will remain  in effect (meaning this test can be used) for the duration of the COVID-19 declaration under Section 564(b)(1) of the Act, 21 U.S.C.section 360bbb-3(b)(1), unless the authorization is terminated  or revoked sooner.       Influenza A by PCR NEGATIVE NEGATIVE Final   Influenza B by PCR NEGATIVE NEGATIVE Final    Comment: (NOTE) The Xpert Xpress SARS-CoV-2/FLU/RSV plus assay is intended as an aid in the diagnosis of influenza from Nasopharyngeal swab specimens and should not be used as a sole basis for treatment. Nasal washings and aspirates are unacceptable for Xpert Xpress SARS-CoV-2/FLU/RSV testing.  Fact Sheet for Patients: BloggerCourse.com  Fact Sheet for Healthcare Providers: SeriousBroker.it  This test is not yet approved or cleared by the [place] FDA and has been authorized for detection and/or diagnosis of SARS-CoV-2 by FDA under an Emergency Use Authorization (EUA). This EUA will remain in effect (meaning this test can be used) for the duration of the COVID-19 declaration under Section 564(b)(1) of the Act, 21 U.S.C. section 360bbb-3(b)(1), unless the authorization is terminated or revoked.  Performed at [facility], [address] [place], [place] [address]          Radiology Studies: US PELVIC COMPLETE WITH TRANSVAGINAL  Result Date: 05/20/2021 CLINICAL DATA:  Postmenopausal vaginal bleeding EXAM: TRANSABDOMINAL AND TRANSVAGINAL ULTRASOUND OF PELVIS TECHNIQUE: Both transabdominal and transvaginal ultrasound examinations of the pelvis were performed. Transabdominal technique was performed for global imaging of the  pelvis including uterus, ovaries, adnexal regions, and pelvic cul-de-sac. It was necessary to proceed with endovaginal exam following the transabdominal exam to visualize the endometrium and ovaries bilaterally. COMPARISON:  None FINDINGS: Uterus Measurements: 4.5 x 3.0 x 4.1 cm = volume: 29 mL. No fibroids or other mass visualized. Endometrium Thickness: 3 mm. There is small simple appearing free fluid within the endometrial cavity measuring up to 2 mm Right ovary Not visualized.  No adnexal mass. Left ovary Not visualized, no adnexal mass. Other findings There is moderate simple appearing fluid within the pelvis, nonspecific. IMPRESSION: Moderate simple appearing free fluid within the pelvis. Nonvisualization of the ovaries. Trace simple appearing fluid within the endometrial cavity measuring up to 2 mm. Normal endometrial thickness. Electronically Signed   By: Helyn Numbers M.D.   On: 05/20/2021 20:18  Scheduled Meds:  sodium chloride   Intravenous Once   amLODipine  10 mg Oral Daily   atorvastatin  80 mg Oral QHS   carvedilol  25 mg Oral BID WC   dicyclomine  20 mg Oral TID AC & HS   DULoxetine  30 mg Oral Daily   hydrALAZINE  50 mg Oral Q8H   pantoprazole (PROTONIX) IV  40 mg Intravenous Q12H   pregabalin  100 mg Oral TID   Continuous Infusions:  sodium chloride       LOS: 0 days    Time spent: 35 minutes    Tresa Moore, MD [facility]   If 7PM-7AM, please contact night-coverage  05/21/2021, 10:54 AM

## 2021-05-21 NOTE — Plan of Care (Signed)
  Problem: Education: Goal: Knowledge of General Education information will improve Description: Including pain rating scale, medication(s)/side effects and non-pharmacologic comfort measures Outcome: Progressing Note: Patient profile completed. Pain is improving. Skin intact. Educating about NPO status.

## 2021-05-21 NOTE — Consult Note (Signed)
Wyline Mood , MD [address], [address], [place], [place], [address], [address], [place], [place], [address] Phone: [phone]  Fax: [phone]  Consultation  Referring Provider:     Dr Georgeann Oppenheim Primary Care Physician:  System, Provider Not In Primary Gastroenterologist:  [facility]          Reason for Consultation:     Anemia  Date of Admission:  05/20/2021 Date of Consultation:  05/21/2021         HPI:   Caroline Smith is a 60 y.o. female who has previously been seen by [facility] back in 2020.  Review of care everywhere appears that he has had an upper GI endoscopy, colonoscopy, capsule study of the small bowel.  Carries a diagnosis of cirrhosis.  I do not see any follow-up after that.  Cirrhosis was attributed to hepatitis C.  EGD showed no evidence of bleeding capsule study showed nonbleeding AVMs at that point of time she was on Plavix, Pletal.  Eventually GI etiology of her anemia was felt to be due to GI bleeding and possible anemia of chronic disease and a history of gammopathy also probably contributed to the anemia.  Subsequently has had no follow-up.  Hemoglobin in July 2022 was 11.4 g with an MCV of 94.  INR was 1.1 albumin of 3.0 normal AST ALT.  GFR of 37.  This admission was admitted on 05/20/2021 with a complaint of vaginal bleeding.  She has a history of PVD, PAD, cardiomyopathy with a EF of 25%.  She has not had a menstrual bleed for over 8 years.  Some concern of the bleeding to be from rectum with the patient not really being sure .   Underwent a pelvic and transvaginal ultrasound that showed simple appearing fluid within the endometrial cavity.  No endometrial thickness.  On admission hemoglobin was 6.1 g with an MCV of 87.5 Problem for's, different.  BUN was normal and creatinine of 1.88.  INR was 1.1 this morning hemoglobin 7.8 g and a CMP showing worsening of her creatinine.  She comlains if mild left sided abdominal discomfort for a few days,  she has been taking NSAID for a tooth ache for a few days, had another episode of bleeding which she think is from her vagina.    Past Medical History:  Diagnosis Date   Anxiety    Bronchitis    GERD (gastroesophageal reflux disease)    Hypertension     History reviewed. No pertinent surgical history.  Prior to Admission medications   Medication Sig Start Date End Date Taking? Authorizing Provider  acetaminophen (TYLENOL) 500 MG tablet Take 1,000 mg by mouth every 6 (six) hours as needed for mild pain. 06/18/19  Yes [provider]  albuterol (PROVENTIL) (2.5 MG/3ML) 0.083% nebulizer solution Inhale 3 mLs into the lungs every 6 (six) hours as needed for wheezing. 05/19/20  Yes [provider]  ALLERGY RELIEF 10 MG tablet Take 10 mg by mouth daily. 12/21/20  Yes [provider]  aluminum-magnesium hydroxide-simethicone (MAALOX) 200-200-20 MG/5ML SUSP Take 30 mLs by mouth 4 (four) times daily -  before meals and at bedtime. 12/23/20  Yes Sharman Cheek, MD  amLODipine (NORVASC) 10 MG tablet Take 10 mg by mouth daily.   Yes [provider]  ASPIRIN LOW DOSE 81 MG EC tablet Take 81 mg by mouth daily. 12/27/20  Yes [provider]  atorvastatin (LIPITOR) 80 MG tablet Take 80 mg by mouth  at bedtime. 12/27/20  Yes [provider]  carvedilol (COREG) 25 MG tablet Take 1 tablet (25 mg total) by mouth 2 (two) times daily with a meal. 01/21/21  Yes Delfino Lovett, MD  clopidogrel (PLAVIX) 75 MG tablet Take 75 mg by mouth daily. 12/06/20  Yes [provider]  DULoxetine (CYMBALTA) 30 MG capsule Take 30 mg by mouth daily. 04/14/21  Yes [provider]  furosemide (LASIX) 40 MG tablet Take 40 mg by mouth daily as needed. 03/18/21  Yes [provider]  gabapentin (NEURONTIN) 100 MG capsule Take 100 mg by mouth 3 (three) times daily. 12/28/20  Yes [provider]  JARDIANCE 10 MG TABS tablet Take 10 mg by mouth daily. 12/28/20   Yes [provider]  lisinopril (ZESTRIL) 20 MG tablet Take 20 mg by mouth daily. 04/14/21  Yes [provider]  omeprazole (PRILOSEC) 20 MG capsule Take 20 mg by mouth daily. 12/21/20  Yes [provider]  ondansetron (ZOFRAN) 4 MG tablet Take 4 mg by mouth every 8 (eight) hours as needed for nausea or vomiting.   Yes [provider]  oxyCODONE-acetaminophen (PERCOCET/ROXICET) 5-325 MG tablet Take 1 tablet by mouth every 8 (eight) hours as needed. 04/30/21  Yes [provider]  pregabalin (LYRICA) 100 MG capsule Take 100 mg by mouth 3 (three) times daily. 04/14/21  Yes [provider]  PROAIR HFA 108 (90 Base) MCG/ACT inhaler Inhale 2 puffs into the lungs every 6 (six) hours as needed for wheezing or shortness of breath. 12/27/20  Yes [provider]  traZODone (DESYREL) 50 MG tablet Take 50 mg by mouth at bedtime as needed for sleep.   Yes [provider]    Family History  Problem Relation Age of Onset   Hypertension Mother      Social History   Tobacco Use   Smoking status: Every Day    Types: Cigarettes   Smokeless tobacco: Never  Substance Use Topics   Alcohol use: No   Drug use: No    Allergies as of 05/20/2021 - Review Complete 05/20/2021  Allergen Reaction Noted   Tramadol Other (See Comments) and Itching 12/28/2014    Review of Systems:    All systems reviewed and negative except where noted in HPI.   Physical Exam:  Vital signs in last 24 hours: Temp:  [97.4 F (36.3 C)-98.3 F (36.8 C)] 97.9 F (36.6 C) (10/08 0738) Pulse Rate:  [67-77] 70 (10/08 0738) Resp:  [16-20] 16 (10/08 0738) BP: (138-207)/(61-123) 172/94 (10/08 0738) SpO2:  [97 %-100 %] 99 % (10/08 0738) Weight:  [51.7 kg-54.7 kg] 54.7 kg (10/08 0241) Last BM Date: 05/20/21 General:   Pleasant, cooperative in NAD Head:  Normocephalic and atraumatic. Eyes:   No icterus.   Conjunctiva pink. PERRLA. Ears:  Normal auditory acuity. Neck:   Supple; no masses or thyroidomegaly Lungs: Respirations even and unlabored. Lungs clear to auscultation bilaterally.   No wheezes, crackles, or rhonchi.  Heart:  Regular rate and rhythm;  Without murmur, clicks, rubs or gallops Abdomen:  Soft, nondistended, nontender. Normal bowel sounds. No appreciable masses or hepatomegaly.  No rebound or guarding.  Neurologic:  Alert and oriented x3;  grossly normal neurologically. Psych:  Alert and cooperative. Normal affect.  LAB RESULTS: Recent Labs    05/20/21 1529 05/21/21 0703 05/21/21 0854  WBC 5.3 6.1  --   HGB 6.1* 7.7* 7.8*  HCT 20.3* 24.0*  --   PLT 170 236  --  BMET Recent Labs    05/20/21 1529 05/21/21 0703  NA 139 140  K 3.6 3.5  CL 108 109  CO2 26 24  GLUCOSE 98 83  BUN 16 16  CREATININE 1.88* 2.03*  CALCIUM 8.2* 8.7*   LFT Recent Labs    05/21/21 0703  PROT 6.4*  ALBUMIN 2.6*  AST 20  ALT 10  ALKPHOS 119  BILITOT 0.5   PT/INR Recent Labs    05/20/21 1825  LABPROT 14.6  INR 1.1    STUDIES: US PELVIC COMPLETE WITH TRANSVAGINAL  Result Date: 05/20/2021 CLINICAL DATA:  Postmenopausal vaginal bleeding EXAM: TRANSABDOMINAL AND TRANSVAGINAL ULTRASOUND OF PELVIS TECHNIQUE: Both transabdominal and transvaginal ultrasound examinations of the pelvis were performed. Transabdominal technique was performed for global imaging of the pelvis including uterus, ovaries, adnexal regions, and pelvic cul-de-sac. It was necessary to proceed with endovaginal exam following the transabdominal exam to visualize the endometrium and ovaries bilaterally. COMPARISON:  None FINDINGS: Uterus Measurements: 4.5 x 3.0 x 4.1 cm = volume: 29 mL. No fibroids or other mass visualized. Endometrium Thickness: 3 mm. There is small simple appearing free fluid within the endometrial cavity measuring up to 2 mm Right ovary Not visualized.  No adnexal mass. Left ovary Not visualized, no adnexal mass. Other findings There is moderate simple appearing  fluid within the pelvis, nonspecific. IMPRESSION: Moderate simple appearing free fluid within the pelvis. Nonvisualization of the ovaries. Trace simple appearing fluid within the endometrial cavity measuring up to 2 mm. Normal endometrial thickness. Electronically Signed   By: Helyn Numbers M.D.   On: 05/20/2021 20:18      Impression / Plan:   Caroline Smith is a 60 y.o. y/o female with a HISTORY of hepatitis C as well as cardiomyopathy CKD cirrhosis secondary to hepatitis C.  Prior work-up in 2020 and viewed with upper endoscopy colonoscopy and capsule study of the small bowel for anemia only found AVMs of the small bowel.  Unclear why the AVMs were not treated subsequently or followed up.  Does Not Appear Has Had Further Follow-Up for Her Liver Cirrhosis either .  Admitted with vaginal/rectal bleeding unclear.  She has been on Plavix.  Plan 1.  With unclear history of the bleeding being from rectum or vagina ,  would give her the benefit of doubt and most urgent at this point of time would be to rule out a variceal bleed which is possibly less likely due to no hematemesis and no elevation in BUN/creatinine ratio, nevertheless I think it would be worthwhile to perform an upper endoscopy tomorrow at least 24 to 48 hours of the last dose of Plavix.  If negative will need a colonoscopy after at least 3 days of Plavix being held if any AVMs are seen which can be ablated at the same time.  If negative will need a capsule study of small bowel.    2.  Will need IV antibiotics as we are treating GI bleed with history of cirrhosis.  Continue IV PPI , also suggest octreotide  3. Stop NSAID use which she has been taking for a tooth ache  4. GYN eval with pelvic exam to evaluate possible vaginal bleed.    I have discussed alternative options, risks & benefits,  which include, but are not limited to, bleeding, infection, perforation,respiratory complication & drug reaction.  The patient agrees with this  plan & written consent will be obtained.     Thank you for involving me in the care of  this patient.      LOS: 0 days   Wyline Mood, MD  05/21/2021, 10:52 AM

## 2021-05-22 ENCOUNTER — Encounter: Payer: Self-pay | Admitting: Family Medicine

## 2021-05-22 ENCOUNTER — Encounter
Admission: EM | Disposition: A | Discharge status: Home / Self Care | Payer: Self-pay | Source: Home / Self Care | Attending: Internal Medicine

## 2021-05-22 ENCOUNTER — Inpatient Hospital Stay: Payer: Medicaid Other | Admitting: Anesthesiology

## 2021-05-22 DIAGNOSIS — D62 Acute posthemorrhagic anemia: Secondary | ICD-10-CM | POA: Diagnosis not present

## 2021-05-22 LAB — TYPE AND SCREEN
ABO/RH(D): B POS
Antibody Screen: NEGATIVE
Unit division: 0
Unit division: 0

## 2021-05-22 LAB — HEMOGLOBIN
Hemoglobin: 9.5 g/dL — ABNORMAL LOW (ref 12.0–15.0)
Hemoglobin: 9.5 g/dL — ABNORMAL LOW (ref 12.0–15.0)
Hemoglobin: 9.6 g/dL — ABNORMAL LOW (ref 12.0–15.0)

## 2021-05-22 LAB — BPAM RBC
Blood Product Expiration Date: 202210232359
Blood Product Expiration Date: 202210282359
ISSUE DATE / TIME: 202210072019
ISSUE DATE / TIME: 202210081044
Unit Type and Rh: 7300
Unit Type and Rh: 7300

## 2021-05-22 SURGERY — ESOPHAGOGASTRODUODENOSCOPY (EGD) WITH PROPOFOL
Anesthesia: General

## 2021-05-22 MED ORDER — SODIUM CHLORIDE 0.9 % IV SOLN: INTRAVENOUS | Status: DC

## 2021-05-22 MED ORDER — PEG 3350-KCL-NA BICARB-NACL 420 G PO SOLR
4000.0000 mL | Freq: Once | ORAL | Status: AC
  Administered 2021-05-22: 4000 mL via ORAL
  Filled 2021-05-22: qty 4000

## 2021-05-22 MED ORDER — PROPOFOL 500 MG/50ML IV EMUL
INTRAVENOUS | Status: AC
  Filled 2021-05-22: qty 50

## 2021-05-22 MED ORDER — PHENYLEPHRINE HCL (PRESSORS) 10 MG/ML IV SOLN
INTRAVENOUS | Status: DC | PRN
  Administered 2021-05-22 (×2): 100 ug via INTRAVENOUS

## 2021-05-22 MED ORDER — PROPOFOL 10 MG/ML IV BOLUS
INTRAVENOUS | Status: DC | PRN
  Administered 2021-05-22: 30 mg via INTRAVENOUS
  Administered 2021-05-22: 20 mg via INTRAVENOUS

## 2021-05-22 MED ORDER — PROPOFOL 500 MG/50ML IV EMUL
INTRAVENOUS | Status: DC | PRN
  Administered 2021-05-22: 80 ug/kg/min via INTRAVENOUS

## 2021-05-22 MED ORDER — LIDOCAINE HCL (CARDIAC) PF 100 MG/5ML IV SOSY
PREFILLED_SYRINGE | INTRAVENOUS | Status: DC | PRN
  Administered 2021-05-22: 60 mg via INTRAVENOUS

## 2021-05-22 MED ORDER — ONDANSETRON HCL 4 MG/2ML IJ SOLN
4.0000 mg | INTRAMUSCULAR | Status: DC | PRN
  Administered 2021-05-22 – 2021-05-25 (×3): 4 mg via INTRAVENOUS
  Filled 2021-05-22 (×3): qty 2

## 2021-05-22 MED ORDER — PHENYLEPHRINE HCL (PRESSORS) 10 MG/ML IV SOLN
INTRAVENOUS | Status: AC
  Filled 2021-05-22: qty 1

## 2021-05-22 NOTE — Progress Notes (Signed)
PROGRESS NOTE    Caroline Smith  XBJ:[id number] DOB: October 18, 1960 DOA: 05/20/2021 PCP: System, Provider Not In    Brief Narrative:  60 y.o. female with medical history significant for PVD, PAD, hypertension, cardiomyopathy with EF of 25%, asthma/COPD, cirrhosis with hepatitis C, history of GI bleed, CKD stage III, polysubstance abuse who presents with concerns of bleeding.  Patient reports having free-flowing vaginal bleeding for the past 2 days.  Has been using 3-4 pads daily. Last menstrual was when she was 60yo. Upon further questioning she is not sure if source of bleeding is vaginal or rectal   States last bowel movement was about 2 days ago and did not have blood when she wiped but noticed stool was more dark.  Notes diffuse abdominal pain.  Has nausea but no vomiting.  Denies any diarrhea. She does have a history of suspected GI bleed back in 2020 when she was on aspirin, Plavix and pletal. EGD at that time did not reveal source of bleeding.  Capsule study with nonbleeding AVMs.  Liver ultrasound negative for portal vein thrombus.  At that time her antiplatelets were stopped due to high risk of bleeding.    Patient received 1 unit packed red blood cells for hemoglobin 6.1.  Hemoglobin responded to 7.7.  Patient unclear whether the bleeding may be vaginal or rectal.  Vaginal ultrasound not indicative vaginal bleed.  GI consulted on 10/8.  Plan for EGD and colonoscopy on subsequent days.   Assessment & Plan:   Principal Problem:   Acute blood loss anemia Active Problems:   Liver cirrhosis (HCC)   Uncontrolled hypertension   PAD (peripheral artery disease) (HCC)   Acute-on-chronic kidney injury (HCC)   Chronic HFrEF (heart failure with reduced ejection fraction) (HCC)   COPD (chronic obstructive pulmonary disease) (HCC)   ABLA (acute blood loss anemia)   Acute on chronic blood loss anemia  Hgb of 6.1 from baseline around 11.   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   Last required transfusion back in April 2022 due to suspected bleeding AVMs and was transition to Plavix monotherapy. Transfuse 1 unit PRBC on admission for hemoglobin 6.1 Hemoglobin responded appropriately Still unclear whether the bleeding is vaginal or rectal Patient does not have periods Transvaginal ultrasound with simple fluid not convincing for blood, no endometrial thickening CT abdomen pelvis not possible due to renal insufficiency Plan: No further transfusion at this time Continue twice daily PPI Continue octreotide drip Empiric Rocephin Continue trending hemoglobin Continue holding Plavix EGD today Possible colonoscopy and video capsule endoscopy on subsequent days should no source of bleeding found   PAD  -hx of femoral endartectomy on Plavix due to hx of AVM bleed.  Hold plavix    AKI on CKD stage IIIb -Creatinine progressively worsening -Baseline creatinine 1.57 -Avoid nephrotoxins   Hypertension Blood pressure remains elevated Continue Norvasc 10 mg daily Continue Coreg 25 mg twice daily Add hydralazine 50 mg 3 times daily As needed IV hydralazine   HFrEF -last echo in 11/2020 with LV 25%.   - Monitor fluid status closely with transfusion -Continue Coreg, atorvastatin   Cirrhosis with history of treated hepatitis C - No liver decompensation on admission   COPD  -Continue PRN bronchodilator  DVT prophylaxis: SCD Code Status: Full Family Communication: None today.  Offered to call but patient declined Disposition Plan: Status is: Inpatient  Remains inpatient appropriate because:Inpatient level of care appropriate due to  severity of illness  Dispo: The patient is from: Home              Anticipated d/c is to: Home              Patient currently is not medically stable to d/c.   Difficult to place patient No             Level of care: Progressive Cardiac  Consultants:   GI  Procedures:  EGD 10/9  Antimicrobials: None   Subjective: Seen and examined.  No acute status changes.  Continues to endorse abdominal pain.  Objective: Vitals:   05/22/21 0044 05/22/21 0329 05/22/21 0609 05/22/21 0749  BP: (!) 190/87 (!) 176/87 (!) 159/95 134/85  Pulse: 68 76 83 82  Resp: 16 18  15   Temp: 98 F (36.7 C) 98.1 F (36.7 C)  98.4 F (36.9 C)  TempSrc: Oral     SpO2: 98% 99% 100% 100%  Weight:      Height:        Intake/Output Summary (Last 24 hours) at 05/22/2021 1144 Last data filed at 05/22/2021 1125 Gross per 24 hour  Intake 1042.59 ml  Output --  Net 1042.59 ml   Filed Weights   05/20/21 1526 05/21/21 0241  Weight: 51.7 kg 54.7 kg    Examination:  General exam: No acute distress Respiratory system: Lungs clear.  Normal work of breathing.  Room air Cardiovascular system: S1-S2, RRR, no murmurs, no pedal edema Gastrointestinal system: Soft, ND, positive bowel sounds, mild TTP Central nervous system: Alert and oriented. No focal neurological deficits. Extremities: Symmetric 5 x 5 power. Skin: No rashes, lesions or ulcers Psychiatry: Judgement and insight appear normal. Mood & affect appropriate.     Data Reviewed: I have personally reviewed following labs and imaging studies  CBC: Recent Labs  Lab 05/20/21 1529 05/21/21 0703 05/21/21 0854 05/21/21 1554 05/22/21 0003 05/22/21 0832  WBC 5.3 6.1  --   --   --   --   HGB 6.1* 7.7* 7.8* 10.0* 9.6* 9.5*  HCT 20.3* 24.0*  --   --   --   --   MCV 87.5 86.0  --   --   --   --   PLT 170 236  --   --   --   --    Basic Metabolic Panel: Recent Labs  Lab 05/20/21 1529 05/21/21 0703  NA 139 140  K 3.6 3.5  CL 108 109  CO2 26 24  GLUCOSE 98 83  BUN 16 16  CREATININE 1.88* 2.03*  CALCIUM 8.2* 8.7*   GFR: Estimated Creatinine Clearance: 26.1 mL/min (A) (by C-G formula based on SCr of 2.03 mg/dL (H)). Liver Function Tests: Recent Labs  Lab 05/21/21 0703  AST 20  ALT 10   ALKPHOS 119  BILITOT 0.5  PROT 6.4*  ALBUMIN 2.6*   No results for input(s): LIPASE, AMYLASE in the last 168 hours. No results for input(s): AMMONIA in the last 168 hours. Coagulation Profile: Recent Labs  Lab 05/20/21 1825  INR 1.1   Cardiac Enzymes: No results for input(s): CKTOTAL, CKMB, CKMBINDEX, TROPONINI in the last 168 hours. BNP (last 3 results) No results for input(s): PROBNP in the last 8760 hours. HbA1C: No results for input(s): HGBA1C in the last 72 hours. CBG: No results for input(s): GLUCAP in the last 168 hours. Lipid Profile: No results for input(s): CHOL, HDL, LDLCALC, TRIG, CHOLHDL, LDLDIRECT in the last 72 hours. Thyroid Function Tests:  No results for input(s): TSH, T4TOTAL, FREET4, T3FREE, THYROIDAB in the last 72 hours. Anemia Panel: No results for input(s): VITAMINB12, FOLATE, FERRITIN, TIBC, IRON, RETICCTPCT in the last 72 hours. Sepsis Labs: No results for input(s): PROCALCITON, LATICACIDVEN in the last 168 hours.  Recent Results (from the past 240 hour(s))  Resp Panel by RT-PCR (Flu A&B, Covid) Nasopharyngeal Swab     Status: None   Collection Time: 05/20/21  6:17 PM   Specimen: Nasopharyngeal Swab; Nasopharyngeal(NP) swabs in vial transport medium  Result Value Ref Range Status   SARS Coronavirus 2 by RT PCR NEGATIVE NEGATIVE Final    Comment: (NOTE) SARS-CoV-2 target nucleic acids are NOT DETECTED.  The SARS-CoV-2 RNA is generally detectable in upper respiratory specimens during the acute phase of infection. The lowest concentration of SARS-CoV-2 viral copies this assay can detect is 138 copies/mL. A negative result does not preclude SARS-Cov-2 infection and should not be used as the sole basis for treatment or other patient management decisions. A negative result may occur with  improper specimen collection/handling, submission of specimen other than nasopharyngeal swab, presence of viral mutation(s) within the areas targeted by this  assay, and inadequate number of viral copies(<138 copies/mL). A negative result must be combined with clinical observations, patient history, and epidemiological information. The expected result is Negative.  Fact Sheet for Patients:  BloggerCourse.com  Fact Sheet for Healthcare Providers:  SeriousBroker.it  This test is no t yet approved or cleared by the [place] FDA and  has been authorized for detection and/or diagnosis of SARS-CoV-2 by FDA under an Emergency Use Authorization (EUA). This EUA will remain  in effect (meaning this test can be used) for the duration of the COVID-19 declaration under Section 564(b)(1) of the Act, 21 U.S.C.section 360bbb-3(b)(1), unless the authorization is terminated  or revoked sooner.       Influenza A by PCR NEGATIVE NEGATIVE Final   Influenza B by PCR NEGATIVE NEGATIVE Final    Comment: (NOTE) The Xpert Xpress SARS-CoV-2/FLU/RSV plus assay is intended as an aid in the diagnosis of influenza from Nasopharyngeal swab specimens and should not be used as a sole basis for treatment. Nasal washings and aspirates are unacceptable for Xpert Xpress SARS-CoV-2/FLU/RSV testing.  Fact Sheet for Patients: BloggerCourse.com  Fact Sheet for Healthcare Providers: SeriousBroker.it  This test is not yet approved or cleared by the [place] FDA and has been authorized for detection and/or diagnosis of SARS-CoV-2 by FDA under an Emergency Use Authorization (EUA). This EUA will remain in effect (meaning this test can be used) for the duration of the COVID-19 declaration under Section 564(b)(1) of the Act, 21 U.S.C. section 360bbb-3(b)(1), unless the authorization is terminated or revoked.  Performed at [facility], [address] [place], [place] [address]          Radiology Studies: US PELVIC COMPLETE WITH TRANSVAGINAL  Result  Date: 05/20/2021 CLINICAL DATA:  Postmenopausal vaginal bleeding EXAM: TRANSABDOMINAL AND TRANSVAGINAL ULTRASOUND OF PELVIS TECHNIQUE: Both transabdominal and transvaginal ultrasound examinations of the pelvis were performed. Transabdominal technique was performed for global imaging of the pelvis including uterus, ovaries, adnexal regions, and pelvic cul-de-sac. It was necessary to proceed with endovaginal exam following the transabdominal exam to visualize the endometrium and ovaries bilaterally. COMPARISON:  None FINDINGS: Uterus Measurements: 4.5 x 3.0 x 4.1 cm = volume: 29 mL. No fibroids or other mass visualized. Endometrium Thickness: 3 mm. There is small simple appearing free fluid within the endometrial cavity measuring up to 2  mm Right ovary Not visualized.  No adnexal mass. Left ovary Not visualized, no adnexal mass. Other findings There is moderate simple appearing fluid within the pelvis, nonspecific. IMPRESSION: Moderate simple appearing free fluid within the pelvis. Nonvisualization of the ovaries. Trace simple appearing fluid within the endometrial cavity measuring up to 2 mm. Normal endometrial thickness. Electronically Signed   By: Helyn Numbers M.D.   On: 05/20/2021 20:18        Scheduled Meds:  amLODipine  10 mg Oral Daily   atorvastatin  80 mg Oral QHS   carvedilol  25 mg Oral BID WC   dicyclomine  20 mg Oral TID AC & HS   DULoxetine  30 mg Oral Daily   hydrALAZINE  50 mg Oral Q8H   pantoprazole (PROTONIX) IV  40 mg Intravenous Q12H   pregabalin  100 mg Oral TID   Continuous Infusions:  sodium chloride     sodium chloride 20 mL/hr at 05/22/21 0901   cefTRIAXone (ROCEPHIN)  IV 2 g (05/21/21 1321)   octreotide  (SANDOSTATIN)    IV infusion 50 mcg/hr (05/22/21 1125)     LOS: 1 day    Time spent: 25 minutes    Tresa Moore, MD [facility]   If 7PM-7AM, please contact night-coverage  05/22/2021, 11:44 AM

## 2021-05-22 NOTE — Transfer of Care (Signed)
Immediate Anesthesia Transfer of Care Note  Patient: Caroline Smith  Procedure(s) Performed: ESOPHAGOGASTRODUODENOSCOPY (EGD) WITH PROPOFOL FLEXIBLE SIGMOIDOSCOPY  Patient Location: PACU  Anesthesia Type:MAC  Level of Consciousness: awake  Airway & Oxygen Therapy: Patient Spontanous Breathing  Post-op Assessment: Report given to RN  Post vital signs: stable  Last Vitals:  Vitals Value Taken Time  BP    Temp    Pulse    Resp    SpO2      Last Pain:  Vitals:   05/22/21 1208  TempSrc: Temporal  PainSc: 0-No pain         Complications: No notable events documented.

## 2021-05-22 NOTE — Anesthesia Preprocedure Evaluation (Addendum)
Anesthesia Evaluation    Airway Mallampati: II  TM Distance: >3 FB Neck ROM: Full    Dental  (+) Poor Dentition, Missing   Pulmonary COPD, Current Smoker,    Pulmonary exam normal        Cardiovascular hypertension, + Peripheral Vascular Disease and +CHF  Normal cardiovascular exam  LVEF 25%   Neuro/Psych PSYCHIATRIC DISORDERS Anxiety Depression    GI/Hepatic GERD  ,(+) Cirrhosis     substance abuse  , Hepatitis -, C? GI bleed.  Hbg 6.1 upon admission s/p 2U prbc    Endo/Other    Renal/GU Renal disease     Musculoskeletal   Abdominal   Peds  Hematology   Anesthesia Other Findings   Reproductive/Obstetrics                            Anesthesia Physical Anesthesia Plan  ASA: 4  Anesthesia Plan: General   Post-op Pain Management:    Induction: Intravenous  PONV Risk Score and Plan: 1 and TIVA and Propofol infusion  Airway Management Planned: Natural Airway  Additional Equipment:   Intra-op Plan:   Post-operative Plan:   Informed Consent: I have reviewed the patients History and Physical, chart, labs and discussed the procedure including the risks, benefits and alternatives for the proposed anesthesia with the patient or authorized representative who has indicated his/her understanding and acceptance.       Plan Discussed with:   Anesthesia Plan Comments:         Anesthesia Quick Evaluation

## 2021-05-22 NOTE — Anesthesia Postprocedure Evaluation (Signed)
Anesthesia Post Note  Patient: Caroline Smith  Procedure(s) Performed: ESOPHAGOGASTRODUODENOSCOPY (EGD) WITH PROPOFOL FLEXIBLE SIGMOIDOSCOPY  Patient location during evaluation: PACU Anesthesia Type: General Level of consciousness: awake and alert Pain management: pain level controlled Vital Signs Assessment: post-procedure vital signs reviewed and stable Respiratory status: spontaneous breathing Cardiovascular status: blood pressure returned to baseline (baseline HTN) Anesthetic complications: no Comments: No anesthetic issues;   No notable events documented.   Last Vitals:  Vitals:   05/22/21 1300 05/22/21 1315  BP: (!) 191/89 (!) 173/88  Pulse: 76 73  Resp:    Temp:    SpO2: 99%     Last Pain:  Vitals:   05/22/21 1315  TempSrc:   PainSc: 0-No pain                 Briant Cedar

## 2021-05-22 NOTE — Op Note (Addendum)
[facility] Gastroenterology Patient Name: Caroline Smith Procedure Date: 05/22/2021 12:19 PM MRN: [id number] Account #: [id number] Date of Birth: 03/08/1961 Admit Type: Inpatient Age: 60 Room: 4 Gender: Female Note Status: Finalized Instrument Name: Upper Endoscope [id number] Procedure:             Flexible Sigmoidoscopy Indications:           Hematochezia Providers:             Wyline Mood MD, MD Referring MD:          Wyline Mood MD, MD (Referring MD) Medicines:             Monitored Anesthesia Care Complications:         No immediate complications. Procedure:             Pre-Anesthesia Assessment:                        - Prior to the procedure, a History and Physical was                         performed, and patient medications, allergies and                         sensitivities were reviewed. The patient's tolerance                         of previous anesthesia was reviewed.                        - The risks and benefits of the procedure and the                         sedation options and risks were discussed with the                         patient. All questions were answered and informed                         consent was obtained.                        - ASA Grade Assessment: III - A patient with severe                         systemic disease.                        After obtaining informed consent, the scope was passed                         under direct vision. The Endoscope was introduced                         through the anus and advanced to the the descending                         colon. The flexible sigmoidoscopy was accomplished                         without difficulty. The  patient tolerated the                         procedure well. The quality of the bowel preparation                         was fair. Findings:      The perianal and digital rectal examinations were normal.      Clotted blood was found in the rectum, in the sigmoid  colon and in the       descending colon. Impression:            - Preparation of the colon was fair.                        - Blood in the rectum, in the sigmoid colon and in the                         descending colon.                        - No specimens collected. Recommendation:        - Return patient to hospital ward for ongoing care.                        - Keep NPO                        Colonoscopy tomorrow                        IF bleeding in the interim get tagged RBC scan                        Dark red blood and clots seen in the left colon Procedure Code(s):     --- Professional ---                        [phone], Sigmoidoscopy, flexible; diagnostic, including                         collection of specimen(s) by brushing or washing, when                         performed (separate procedure) Diagnosis Code(s):     --- Professional ---                        K62.5, Hemorrhage of anus and rectum                        K92.2, Gastrointestinal hemorrhage, unspecified                        K92.1, Melena (includes Hematochezia) CPT copyright 2019 American Medical Association. All rights reserved. The codes documented in this report are preliminary and upon coder review may  be revised to meet current compliance requirements. Wyline Mood, MD Wyline Mood MD, MD 05/22/2021 12:38:44 PM This report has been signed electronically. Number of Addenda: 0 Note Initiated On: 05/22/2021 12:19 PM Total Procedure Duration: 0 hours 3 minutes 6 seconds  Estimated Blood Loss:  Estimated blood loss: none.      [facility]

## 2021-05-22 NOTE — H&P (Signed)
Wyline Mood, MD [address], [place], [place], [address] [address], [address], [place], [place], [address] Phone: [phone]  Fax: [phone]  Primary Care Physician:  System, Provider Not In   Pre-Procedure History & Physical: HPI:  Mililani Murthy is a 60 y.o. female is here for an endoscopy and flexible sigmoidoscopy   Past Medical History:  Diagnosis Date   Anxiety    Bronchitis    GERD (gastroesophageal reflux disease)    Hypertension     History reviewed. No pertinent surgical history.  Prior to Admission medications   Medication Sig Start Date End Date Taking? Authorizing Provider  acetaminophen (TYLENOL) 500 MG tablet Take 1,000 mg by mouth every 6 (six) hours as needed for mild pain. 06/18/19  Yes [provider]  albuterol (PROVENTIL) (2.5 MG/3ML) 0.083% nebulizer solution Inhale 3 mLs into the lungs every 6 (six) hours as needed for wheezing. 05/19/20  Yes [provider]  ALLERGY RELIEF 10 MG tablet Take 10 mg by mouth daily. 12/21/20  Yes [provider]  aluminum-magnesium hydroxide-simethicone (MAALOX) 200-200-20 MG/5ML SUSP Take 30 mLs by mouth 4 (four) times daily -  before meals and at bedtime. 12/23/20  Yes Sharman Cheek, MD  amLODipine (NORVASC) 10 MG tablet Take 10 mg by mouth daily.   Yes [provider]  ASPIRIN LOW DOSE 81 MG EC tablet Take 81 mg by mouth daily. 12/27/20  Yes [provider]  atorvastatin (LIPITOR) 80 MG tablet Take 80 mg by mouth at bedtime. 12/27/20  Yes [provider]  carvedilol (COREG) 25 MG tablet Take 1 tablet (25 mg total) by mouth 2 (two) times daily with a meal. 01/21/21  Yes Delfino Lovett, MD  clopidogrel (PLAVIX) 75 MG tablet Take 75 mg by mouth daily. 12/06/20  Yes [provider]  DULoxetine (CYMBALTA) 30 MG capsule Take 30 mg by mouth daily. 04/14/21  Yes [provider]  furosemide (LASIX) 40 MG tablet Take 40 mg by mouth daily as  needed. 03/18/21  Yes [provider]  gabapentin (NEURONTIN) 100 MG capsule Take 100 mg by mouth 3 (three) times daily. 12/28/20  Yes [provider]  JARDIANCE 10 MG TABS tablet Take 10 mg by mouth daily. 12/28/20  Yes [provider]  lisinopril (ZESTRIL) 20 MG tablet Take 20 mg by mouth daily. 04/14/21  Yes [provider]  omeprazole (PRILOSEC) 20 MG capsule Take 20 mg by mouth daily. 12/21/20  Yes [provider]  ondansetron (ZOFRAN) 4 MG tablet Take 4 mg by mouth every 8 (eight) hours as needed for nausea or vomiting.   Yes [provider]  oxyCODONE-acetaminophen (PERCOCET/ROXICET) 5-325 MG tablet Take 1 tablet by mouth every 8 (eight) hours as needed. 04/30/21  Yes [provider]  pregabalin (LYRICA) 100 MG capsule Take 100 mg by mouth 3 (three) times daily. 04/14/21  Yes [provider]  PROAIR HFA 108 (90 Base) MCG/ACT inhaler Inhale 2 puffs into the lungs every 6 (six) hours as needed for wheezing or shortness of breath. 12/27/20  Yes [provider]  traZODone (DESYREL) 50 MG tablet Take 50 mg by mouth at bedtime as needed for sleep.   Yes [provider]    Allergies as of 05/20/2021 - Review Complete 05/20/2021  Allergen Reaction Noted   Tramadol Other (See Comments) and Itching 12/28/2014    Family History  Problem Relation Age of Onset   Hypertension Mother     Social  History   Socioeconomic History   Marital status: Single    Spouse name: Not on file   Number of children: Not on file   Years of education: Not on file   Highest education level: Not on file  Occupational History   Not on file  Tobacco Use   Smoking status: Every Day    Types: Cigarettes   Smokeless tobacco: Never  Vaping Use   Vaping Use: Never used  Substance and Sexual Activity   Alcohol use: No   Drug use: No   Sexual activity: Not on file  Other Topics Concern   Not on file  Social History Narrative   Not  on file   Social Determinants of Health   Financial Resource Strain: Not on file  Food Insecurity: Not on file  Transportation Needs: Not on file  Physical Activity: Not on file  Stress: Not on file  Social Connections: Not on file  Intimate Partner Violence: Not on file    Review of Systems: See HPI, otherwise negative ROS  Physical Exam: BP 134/85 (BP Location: Left Arm)   Pulse 82   Temp 98.4 F (36.9 C)   Resp 15   Ht 5\' 7"  (1.702 m)   Wt 54.7 kg   LMP 12/26/2012 (Approximate)   SpO2 100%   BMI 18.87 kg/m  General:   Alert,  pleasant and cooperative in NAD Head:  Normocephalic and atraumatic. Neck:  Supple; no masses or thyromegaly. Lungs:  Clear throughout to auscultation, normal respiratory effort.    Heart:  +S1, +S2, Regular rate and rhythm, No edema. Abdomen:  Soft, nontender and nondistended. Normal bowel sounds, without guarding, and without rebound.   Neurologic:  Alert and  oriented x4;  grossly normal neurologically.  Impression/Plan: 12/28/2012 Mccumbers is here for an endoscopy and flexioble sigmoidoscopy  to be performed for  evaluation of gi bleed    Risks, benefits, limitations, and alternatives regarding endoscopy have been reviewed with the patient.  Questions have been answered.  All parties agreeable.   Burman Nieves, MD  05/22/2021, 12:08 PM

## 2021-05-22 NOTE — Op Note (Signed)
[facility] Gastroenterology Patient Name: Caroline Smith Procedure Date: 05/22/2021 10:28 AM MRN: [id number] Account #: [id number] Date of Birth: [birth year] Admit Type: Inpatient Age: 60 Room: [facility] ENDO ROOM 4 Gender: Female Note Status: Finalized Instrument Name: Upper Endoscope [id number] Procedure:             Upper GI endoscopy Indications:           Heme positive stool Providers:             Wyline Mood MD, MD Referring MD:          No Local Md, MD (Referring MD) Medicines:             Monitored Anesthesia Care Complications:         No immediate complications. Procedure:             Pre-Anesthesia Assessment:                        - Prior to the procedure, a History and Physical was                         performed, and patient medications, allergies and                         sensitivities were reviewed. The patient's tolerance                         of previous anesthesia was reviewed.                        - The risks and benefits of the procedure and the                         sedation options and risks were discussed with the                         patient. All questions were answered and informed                         consent was obtained.                        - ASA Grade Assessment: III - A patient with severe                         systemic disease.                        After obtaining informed consent, the endoscope was                         passed under direct vision. Throughout the procedure,                         the patient's blood pressure, pulse, and oxygen                         saturations were monitored continuously. The Endoscope  was introduced through the mouth, and advanced to the                         third part of duodenum. The upper GI endoscopy was                         accomplished with ease. The patient tolerated the                         procedure well. Findings:      The esophagus  was normal.      The stomach was normal.      The examined duodenum was normal. Impression:            - Normal esophagus.                        - Normal stomach.                        - Normal examined duodenum.                        - No specimens collected. Recommendation:        - Perform a flexible sigmoidoscopy for further                         evaluation of the rectum and sigmoid colon today. Procedure Code(s):     --- Professional ---                        [phone], Esophagogastroduodenoscopy, flexible,                         transoral; diagnostic, including collection of                         specimen(s) by brushing or washing, when performed                         (separate procedure) Diagnosis Code(s):     --- Professional ---                        R19.5, Other fecal abnormalities CPT copyright 2019 American Medical Association. All rights reserved. The codes documented in this report are preliminary and upon coder review may  be revised to meet current compliance requirements. Wyline Mood, MD Wyline Mood MD, MD 05/22/2021 12:32:36 PM This report has been signed electronically. Number of Addenda: 0 Note Initiated On: 05/22/2021 10:28 AM Estimated Blood Loss:  Estimated blood loss: none.      [facility]

## 2021-05-23 DIAGNOSIS — D62 Acute posthemorrhagic anemia: Secondary | ICD-10-CM | POA: Diagnosis not present

## 2021-05-23 DIAGNOSIS — K921 Melena: Secondary | ICD-10-CM | POA: Diagnosis not present

## 2021-05-23 LAB — HEMOGLOBIN
Hemoglobin: 9 g/dL — ABNORMAL LOW (ref 12.0–15.0)
Hemoglobin: 9.5 g/dL — ABNORMAL LOW (ref 12.0–15.0)
Hemoglobin: 9.8 g/dL — ABNORMAL LOW (ref 12.0–15.0)

## 2021-05-23 MED ORDER — OXYCODONE-ACETAMINOPHEN 5-325 MG PO TABS
1.0000 | ORAL_TABLET | ORAL | Status: DC | PRN
  Administered 2021-05-23 – 2021-05-28 (×14): 1 via ORAL
  Filled 2021-05-23 (×14): qty 1

## 2021-05-23 NOTE — Progress Notes (Signed)
Caroline Bouillon, MD [address], [address], [place], [place], [address] [address], [place], [place], [address] Phone: [phone]  Fax: [phone]   Subjective: Patient resting in bed comfortably.  Did not complete her prep.  Only drank about a quarter of the prep and therefore colonoscopy has been canceled for today.   Objective: Exam: Vital signs in last 24 hours: Vitals:   05/22/21 2345 05/23/21 0327 05/23/21 0812 05/23/21 1112  BP: (!) 171/86 (!) 147/82 (!) 155/89 (!) 155/87  Pulse: 74 75 68 69  Resp: 18 18 16 16   Temp: 97.6 F (36.4 C) 98.2 F (36.8 C) 98.4 F (36.9 C) 97.8 F (36.6 C)  TempSrc:      SpO2: 100% 100% 100% 100%  Weight:      Height:       Weight change:   Intake/Output Summary (Last 24 hours) at 05/23/2021 1304 Last data filed at 05/22/2021 2300 Gross per 24 hour  Intake 273.57 ml  Output 350 ml  Net -76.43 ml    Constitutional: General:   Alert,  Well-developed, well-nourished, pleasant and cooperative in NAD BP (!) 155/87 (BP Location: Left Arm)   Pulse 69   Temp 97.8 F (36.6 C)   Resp 16   Ht 5\' 7"  (1.702 m)   Wt 54.7 kg   LMP 12/26/2012 (Approximate)   SpO2 100%   BMI 18.89 kg/m   Eyes:  Sclera clear, no icterus.   Conjunctiva pink.   Ears:  No scars, lesions or masses, Normal auditory acuity. Nose:  No deformity, discharge, or lesions. Mouth:  No deformity or lesions, oropharynx pink & moist.  Neck:  Supple; no masses, trachea midline  Respiratory: Normal respiratory effort  Gastrointestinal:  Soft, non-tender and non-distended without masses, hepatosplenomegaly or hernias noted.  No guarding or rebound tenderness.     Cardiac: No clubbing or edema.  No cyanosis. Normal posterior tibial pedal pulses noted.  Lymphatic:  No significant cervical adenopathy.  Psych:  Alert and cooperative. Normal mood and affect.  Musculoskeletal:  Head normocephalic, atraumatic. 5/5 Lower extremity strength  bilaterally.  Skin: Warm. Intact without significant lesions or rashes. No jaundice.  Neurologic:  Face symmetrical, tongue midline, Normal sensation to touch  Psych:  Alert and oriented x3, Alert and cooperative. Normal mood and affect.   Lab Results: Lab Results  Component Value Date   WBC 6.1 05/21/2021   HGB 9.5 (L) 05/23/2021   HCT 24.0 (L) 05/21/2021   MCV 86.0 05/21/2021   PLT 236 05/21/2021   Micro Results: Recent Results (from the past 240 hour(s))  Resp Panel by RT-PCR (Flu A&B, Covid) Nasopharyngeal Swab     Status: None   Collection Time: 05/20/21  6:17 PM   Specimen: Nasopharyngeal Swab; Nasopharyngeal(NP) swabs in vial transport medium  Result Value Ref Range Status   SARS Coronavirus 2 by RT PCR NEGATIVE NEGATIVE Final    Comment: (NOTE) SARS-CoV-2 target nucleic acids are NOT DETECTED.  The SARS-CoV-2 RNA is generally detectable in upper respiratory specimens during the acute phase of infection. The lowest concentration of SARS-CoV-2 viral copies this assay can detect is 138 copies/mL. A negative result does not preclude SARS-Cov-2 infection and should not be used as the sole basis for treatment or other patient management decisions. A negative result may occur with  improper specimen collection/handling, submission of specimen other than nasopharyngeal swab, presence of viral mutation(s) within the areas targeted by this assay, and inadequate number of viral copies(<138 copies/mL).  A negative result must be combined with clinical observations, patient history, and epidemiological information. The expected result is Negative.  Fact Sheet for Patients:  BloggerCourse.com  Fact Sheet for Healthcare Providers:  SeriousBroker.it  This test is no t yet approved or cleared by the [place] FDA and  has been authorized for detection and/or diagnosis of SARS-CoV-2 by FDA under an Emergency Use Authorization  (EUA). This EUA will remain  in effect (meaning this test can be used) for the duration of the COVID-19 declaration under Section 564(b)(1) of the Act, 21 U.S.C.section 360bbb-3(b)(1), unless the authorization is terminated  or revoked sooner.       Influenza A by PCR NEGATIVE NEGATIVE Final   Influenza B by PCR NEGATIVE NEGATIVE Final    Comment: (NOTE) The Xpert Xpress SARS-CoV-2/FLU/RSV plus assay is intended as an aid in the diagnosis of influenza from Nasopharyngeal swab specimens and should not be used as a sole basis for treatment. Nasal washings and aspirates are unacceptable for Xpert Xpress SARS-CoV-2/FLU/RSV testing.  Fact Sheet for Patients: BloggerCourse.com  Fact Sheet for Healthcare Providers: SeriousBroker.it  This test is not yet approved or cleared by the [place] FDA and has been authorized for detection and/or diagnosis of SARS-CoV-2 by FDA under an Emergency Use Authorization (EUA). This EUA will remain in effect (meaning this test can be used) for the duration of the COVID-19 declaration under Section 564(b)(1) of the Act, 21 U.S.C. section 360bbb-3(b)(1), unless the authorization is terminated or revoked.  Performed at [facility], [address] [place], [place] [address]    Studies/Results: No results found. Medications:  Scheduled Meds:  amLODipine  10 mg Oral Daily   atorvastatin  80 mg Oral QHS   carvedilol  25 mg Oral BID WC   dicyclomine  20 mg Oral TID AC & HS   DULoxetine  30 mg Oral Daily   hydrALAZINE  50 mg Oral Q8H   pantoprazole (PROTONIX) IV  40 mg Intravenous Q12H   pregabalin  100 mg Oral TID   Continuous Infusions:  sodium chloride     sodium chloride     cefTRIAXone (ROCEPHIN)  IV 2 g (05/23/21 1212)   octreotide  (SANDOSTATIN)    IV infusion 50 mcg/hr (05/23/21 0750)   PRN Meds:.albuterol, hydrALAZINE, ondansetron (ZOFRAN) IV, oxyCODONE-acetaminophen,  sodium chloride flush, traZODone   Assessment: Principal Problem:   Acute blood loss anemia Active Problems:   Liver cirrhosis (HCC)   Uncontrolled hypertension   PAD (peripheral artery disease) (HCC)   Acute-on-chronic kidney injury (HCC)   Chronic HFrEF (heart failure with reduced ejection fraction) (HCC)   COPD (chronic obstructive pulmonary disease) (HCC)   ABLA (acute blood loss anemia)    Plan: I discussed importance of completing the prep with the patient in detail.  She has all day to finish the prep today, until 5 AM tomorrow morning.  I advised her to drink it slowly and sips, and take breaks in the middle if she needs and allow herself to complete the prep until 5 AM tomorrow morning and she verbalized understanding of these instructions  Colonoscopy tomorrow to evaluate for source of red blood seen on flexible sigmoidoscopy yesterday  Hemoglobin stable at this time  I have discussed alternative options, risks & benefits,  which include, but are not limited to, bleeding, infection, perforation,respiratory complication & drug reaction.  The patient agrees with this plan & written consent will be obtained.      LOS: 2 days  Caroline Bouillon, MD 05/23/2021, 1:04 PM

## 2021-05-23 NOTE — Progress Notes (Signed)
PROGRESS NOTE    Shamica Moree Jim  GDJ:[id number] DOB: 1961-02-26 DOA: 05/20/2021 PCP: System, Provider Not In    Brief Narrative:  60 y.o. female with medical history significant for PVD, PAD, hypertension, cardiomyopathy with EF of 25%, asthma/COPD, cirrhosis with hepatitis C, history of GI bleed, CKD stage III, polysubstance abuse who presents with concerns of bleeding.  Patient reports having free-flowing vaginal bleeding for the past 2 days.  Has been using 3-4 pads daily. Last menstrual was when she was 60yo. Upon further questioning she is not sure if source of bleeding is vaginal or rectal   States last bowel movement was about 2 days ago and did not have blood when she wiped but noticed stool was more dark.  Notes diffuse abdominal pain.  Has nausea but no vomiting.  Denies any diarrhea. She does have a history of suspected GI bleed back in 2020 when she was on aspirin, Plavix and pletal. EGD at that time did not reveal source of bleeding.  Capsule study with nonbleeding AVMs.  Liver ultrasound negative for portal vein thrombus.  At that time her antiplatelets were stopped due to high risk of bleeding.    Patient received 1 unit packed red blood cells for hemoglobin 6.1.  Hemoglobin responded to 7.7.  Patient unclear whether the bleeding may be vaginal or rectal.  Vaginal ultrasound not indicative vaginal bleed.  GI consulted on 10/8.  Status post EGD on 10/9.  EGD normal.  Plan for colonoscopy on 10/10 however patient was unable to tolerate bowel prep.  GI aware.  Now plan for clear liquid diet today.  Reattempted bowel prep.  Reattempt C-scope on 10/11.   Assessment & Plan:   Principal Problem:   Acute blood loss anemia Active Problems:   Liver cirrhosis (HCC)   Uncontrolled hypertension   PAD (peripheral artery disease) (HCC)   Acute-on-chronic kidney injury (HCC)   Chronic HFrEF (heart failure with reduced ejection fraction) (HCC)   COPD (chronic obstructive pulmonary  disease) (HCC)   ABLA (acute blood loss anemia)   Acute on chronic blood loss anemia  Hgb of 6.1 from baseline around 11.   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   Last required transfusion back in April 2022 due to suspected bleeding AVMs and was transition to Plavix monotherapy. Transfuse 1 unit PRBC on admission for hemoglobin 6.1 Hemoglobin responded appropriately Still unclear whether the bleeding is vaginal or rectal Patient does not have periods Transvaginal ultrasound with simple fluid not convincing for blood, no endometrial thickening CT abdomen pelvis not possible due to renal insufficiency EGD 10/9, no bleeding Plan: No further transfusion at this time Continue twice daily PPI Continue octreotide drip Empiric Rocephin Continue trending hemoglobin Continue holding Plavix Clear liquid diet today N.p.o. after 12 for C-scope 10/11  PAD  -hx of femoral endartectomy on Plavix due to hx of AVM bleed.  Continue holding Plavix   AKI on CKD stage IIIb -Creatinine progressively worsening -Baseline creatinine 1.57 -Avoid nephrotoxins   Hypertension Blood pressure remains elevated Continue Norvasc 10 mg daily Continue Coreg 25 mg twice daily Continue hydralazine 50 mg 3 times daily As needed IV hydralazine   HFrEF -last echo in 11/2020 with LV 25%.   - Monitor fluid status closely with transfusion -Continue Coreg, atorvastatin   Cirrhosis with history of treated hepatitis C - No liver decompensation on admission   COPD  -Continue PRN bronchodilator  DVT prophylaxis: SCD  Code Status: Full Family Communication: None today.  Offered to call but patient declined Disposition Plan: Status is: Inpatient  Remains inpatient appropriate because:Inpatient level of care appropriate due to severity of illness  Dispo: The patient is from: Home              Anticipated d/c is to: Home              Patient  currently is not medically stable to d/c.   Difficult to place patient No             Level of care: Progressive Cardiac  Consultants:  GI  Procedures:  EGD 10/9  Antimicrobials: None   Subjective: Seen and examined.  No acute status changes.  Continues endorsing abdominal pain.  Continues to have bright red blood per rectum  Objective: Vitals:   05/22/21 2249 05/22/21 2345 05/23/21 0327 05/23/21 0812  BP: (!) 159/88 (!) 171/86 (!) 147/82 (!) 155/89  Pulse: 74 74 75 68  Resp:  18 18 16   Temp:  97.6 F (36.4 C) 98.2 F (36.8 C) 98.4 F (36.9 C)  TempSrc:      SpO2: 100% 100% 100% 100%  Weight:      Height:        Intake/Output Summary (Last 24 hours) at 05/23/2021 1033 Last data filed at 05/22/2021 2300 Gross per 24 hour  Intake 333.35 ml  Output 350 ml  Net -16.65 ml   Filed Weights   05/20/21 1526 05/21/21 0241 05/22/21 1208  Weight: 51.7 kg 54.7 kg 54.7 kg    Examination:  General exam: No acute distress Respiratory system: Lungs clear.  Normal work of breathing.  Room air Cardiovascular system: S1-S2, RRR, no murmurs, no pedal edema Gastrointestinal system: Soft, nondistended, positive bowel sounds, mild epigastric TTP Central nervous system: Alert and oriented. No focal neurological deficits. Extremities: Symmetric 5 x 5 power. Skin: No rashes, lesions or ulcers Psychiatry: Judgement and insight appear normal. Mood & affect appropriate.     Data Reviewed: I have personally reviewed following labs and imaging studies  CBC: Recent Labs  Lab 05/20/21 1529 05/21/21 0703 05/21/21 0854 05/21/21 1554 05/22/21 0003 05/22/21 0832 05/22/21 1626 05/23/21 0002  WBC 5.3 6.1  --   --   --   --   --   --   HGB 6.1* 7.7*   < > 10.0* 9.6* 9.5* 9.5* 9.8*  HCT 20.3* 24.0*  --   --   --   --   --   --   MCV 87.5 86.0  --   --   --   --   --   --   PLT 170 236  --   --   --   --   --   --    < > = values in this interval not displayed.   Basic  Metabolic Panel: Recent Labs  Lab 05/20/21 1529 05/21/21 0703  NA 139 140  K 3.6 3.5  CL 108 109  CO2 26 24  GLUCOSE 98 83  BUN 16 16  CREATININE 1.88* 2.03*  CALCIUM 8.2* 8.7*   GFR: Estimated Creatinine Clearance: 26.1 mL/min (A) (by C-G formula based on SCr of 2.03 mg/dL (H)). Liver Function Tests: Recent Labs  Lab 05/21/21 0703  AST 20  ALT 10  ALKPHOS 119  BILITOT 0.5  PROT 6.4*  ALBUMIN 2.6*   No results for input(s): LIPASE, AMYLASE in the last 168 hours. No results for input(s): AMMONIA  in the last 168 hours. Coagulation Profile: Recent Labs  Lab 05/20/21 1825  INR 1.1   Cardiac Enzymes: No results for input(s): CKTOTAL, CKMB, CKMBINDEX, TROPONINI in the last 168 hours. BNP (last 3 results) No results for input(s): PROBNP in the last 8760 hours. HbA1C: No results for input(s): HGBA1C in the last 72 hours. CBG: No results for input(s): GLUCAP in the last 168 hours. Lipid Profile: No results for input(s): CHOL, HDL, LDLCALC, TRIG, CHOLHDL, LDLDIRECT in the last 72 hours. Thyroid Function Tests: No results for input(s): TSH, T4TOTAL, FREET4, T3FREE, THYROIDAB in the last 72 hours. Anemia Panel: No results for input(s): VITAMINB12, FOLATE, FERRITIN, TIBC, IRON, RETICCTPCT in the last 72 hours. Sepsis Labs: No results for input(s): PROCALCITON, LATICACIDVEN in the last 168 hours.  Recent Results (from the past 240 hour(s))  Resp Panel by RT-PCR (Flu A&B, Covid) Nasopharyngeal Swab     Status: None   Collection Time: 05/20/21  6:17 PM   Specimen: Nasopharyngeal Swab; Nasopharyngeal(NP) swabs in vial transport medium  Result Value Ref Range Status   SARS Coronavirus 2 by RT PCR NEGATIVE NEGATIVE Final    Comment: (NOTE) SARS-CoV-2 target nucleic acids are NOT DETECTED.  The SARS-CoV-2 RNA is generally detectable in upper respiratory specimens during the acute phase of infection. The lowest concentration of SARS-CoV-2 viral copies this assay can detect  is 138 copies/mL. A negative result does not preclude SARS-Cov-2 infection and should not be used as the sole basis for treatment or other patient management decisions. A negative result may occur with  improper specimen collection/handling, submission of specimen other than nasopharyngeal swab, presence of viral mutation(s) within the areas targeted by this assay, and inadequate number of viral copies(<138 copies/mL). A negative result must be combined with clinical observations, patient history, and epidemiological information. The expected result is Negative.  Fact Sheet for Patients:  BloggerCourse.com  Fact Sheet for Healthcare Providers:  SeriousBroker.it  This test is no t yet approved or cleared by the [place] FDA and  has been authorized for detection and/or diagnosis of SARS-CoV-2 by FDA under an Emergency Use Authorization (EUA). This EUA will remain  in effect (meaning this test can be used) for the duration of the COVID-19 declaration under Section 564(b)(1) of the Act, 21 U.S.C.section 360bbb-3(b)(1), unless the authorization is terminated  or revoked sooner.       Influenza A by PCR NEGATIVE NEGATIVE Final   Influenza B by PCR NEGATIVE NEGATIVE Final    Comment: (NOTE) The Xpert Xpress SARS-CoV-2/FLU/RSV plus assay is intended as an aid in the diagnosis of influenza from Nasopharyngeal swab specimens and should not be used as a sole basis for treatment. Nasal washings and aspirates are unacceptable for Xpert Xpress SARS-CoV-2/FLU/RSV testing.  Fact Sheet for Patients: BloggerCourse.com  Fact Sheet for Healthcare Providers: SeriousBroker.it  This test is not yet approved or cleared by the [place] FDA and has been authorized for detection and/or diagnosis of SARS-CoV-2 by FDA under an Emergency Use Authorization (EUA). This EUA will remain in effect  (meaning this test can be used) for the duration of the COVID-19 declaration under Section 564(b)(1) of the Act, 21 U.S.C. section 360bbb-3(b)(1), unless the authorization is terminated or revoked.  Performed at [facility], [address] [place], [place] [address]          Radiology Studies: No results found.      Scheduled Meds:  amLODipine  10 mg Oral Daily   atorvastatin  80 mg  Oral QHS   carvedilol  25 mg Oral BID WC   dicyclomine  20 mg Oral TID AC & HS   DULoxetine  30 mg Oral Daily   hydrALAZINE  50 mg Oral Q8H   pantoprazole (PROTONIX) IV  40 mg Intravenous Q12H   pregabalin  100 mg Oral TID   Continuous Infusions:  sodium chloride     sodium chloride     cefTRIAXone (ROCEPHIN)  IV 2 g (05/21/21 1321)   octreotide  (SANDOSTATIN)    IV infusion 50 mcg/hr (05/23/21 0750)     LOS: 2 days    Time spent: 25 minutes    Tresa Moore, MD [facility]   If 7PM-7AM, please contact night-coverage  05/23/2021, 10:33 AM

## 2021-05-23 NOTE — Progress Notes (Signed)
Pt unable to tolerate the Nulyt[place]. While drinking, pt began to vomit the drink back up and stated," That's it I can't take no more, I can't drink this stuff."   Pt still continued to have maroon blood clots along with bright red blood.

## 2021-05-24 DIAGNOSIS — D62 Acute posthemorrhagic anemia: Secondary | ICD-10-CM | POA: Diagnosis not present

## 2021-05-24 DIAGNOSIS — K921 Melena: Secondary | ICD-10-CM | POA: Diagnosis not present

## 2021-05-24 LAB — CBC WITH DIFFERENTIAL/PLATELET
Abs Immature Granulocytes: 0.04 10*3/uL (ref 0.00–0.07)
Basophils Absolute: 0 10*3/uL (ref 0.0–0.1)
Basophils Relative: 0 %
Eosinophils Absolute: 0.1 10*3/uL (ref 0.0–0.5)
Eosinophils Relative: 2 %
HCT: 28.5 % — ABNORMAL LOW (ref 36.0–46.0)
Hemoglobin: 9.4 g/dL — ABNORMAL LOW (ref 12.0–15.0)
Immature Granulocytes: 1 %
Lymphocytes Relative: 20 %
Lymphs Abs: 1.5 10*3/uL (ref 0.7–4.0)
MCH: 28.7 pg (ref 26.0–34.0)
MCHC: 33 g/dL (ref 30.0–36.0)
MCV: 86.9 fL (ref 80.0–100.0)
Monocytes Absolute: 0.6 10*3/uL (ref 0.1–1.0)
Monocytes Relative: 8 %
Neutro Abs: 5.2 10*3/uL (ref 1.7–7.7)
Neutrophils Relative %: 69 %
Platelets: 202 10*3/uL (ref 150–400)
RBC: 3.28 MIL/uL — ABNORMAL LOW (ref 3.87–5.11)
RDW: 15.3 % (ref 11.5–15.5)
WBC: 7.5 10*3/uL (ref 4.0–10.5)
nRBC: 0 % (ref 0.0–0.2)

## 2021-05-24 LAB — BASIC METABOLIC PANEL
Anion gap: 8 (ref 5–15)
BUN: 11 mg/dL (ref 6–20)
CO2: 25 mmol/L (ref 22–32)
Calcium: 8.4 mg/dL — ABNORMAL LOW (ref 8.9–10.3)
Chloride: 104 mmol/L (ref 98–111)
Creatinine, Ser: 2.11 mg/dL — ABNORMAL HIGH (ref 0.44–1.00)
GFR, Estimated: 27 mL/min — ABNORMAL LOW (ref >60)
Glucose, Bld: 110 mg/dL — ABNORMAL HIGH (ref 70–99)
Potassium: 3.4 mmol/L — ABNORMAL LOW (ref 3.5–5.1)
Sodium: 137 mmol/L (ref 135–145)

## 2021-05-24 LAB — HEMOGLOBIN
Hemoglobin: 9.3 g/dL — ABNORMAL LOW (ref 12.0–15.0)
Hemoglobin: 9.2 g/dL — ABNORMAL LOW (ref 12.0–15.0)
Hemoglobin: 9.9 g/dL — ABNORMAL LOW (ref 12.0–15.0)

## 2021-05-24 MED ORDER — SODIUM CHLORIDE 0.9 % IV SOLN: INTRAVENOUS | Status: DC

## 2021-05-24 MED ORDER — PEG 3350-KCL-NA BICARB-NACL 420 G PO SOLR
4000.0000 mL | Freq: Once | ORAL | Status: AC
  Administered 2021-05-24: 4000 mL via ORAL
  Filled 2021-05-24: qty 4000

## 2021-05-24 MED ORDER — PROPOFOL 500 MG/50ML IV EMUL
INTRAVENOUS | Status: AC
  Filled 2021-05-24: qty 50

## 2021-05-24 NOTE — Progress Notes (Signed)
Melodie Bouillon, MD [address], [place], [place], [address] [address], [address], [place], [place], [address] Phone: [phone]  Fax: [phone]   Subjective: Patient was supposed to have a colonoscopy today.  However, patient ate solid food that she was hiding in her room including grapes, candy.  I confirmed with nursing staff and they report that she has not had any active bleeding   Objective: Exam: Vital signs in last 24 hours: Vitals:   05/23/21 2025 05/23/21 2321 05/24/21 0440 05/24/21 0724  BP: (!) 165/90 (!) 141/72 (!) 174/105 (!) 142/65  Pulse: 63 70 70 72  Resp: 18 16 16 18   Temp: 97.6 F (36.4 C) 98.2 F (36.8 C) 97.8 F (36.6 C) 98.4 F (36.9 C)  TempSrc:    Oral  SpO2: 100% 100% 100% 99%  Weight:      Height:       Weight change:   Intake/Output Summary (Last 24 hours) at 05/24/2021 1335 Last data filed at 05/24/2021 0440 Gross per 24 hour  Intake 890.9 ml  Output 100 ml  Net 790.9 ml    Constitutional: General:   Alert,  Well-developed, well-nourished, pleasant and cooperative in NAD BP (!) 142/65 (BP Location: Left Arm)   Pulse 72   Temp 98.4 F (36.9 C) (Oral)   Resp 18   Ht 5\' 7"  (1.702 m)   Wt 54.7 kg   LMP 12/26/2012 (Approximate)   SpO2 99%   BMI 18.89 kg/m   Eyes:  Sclera clear, no icterus.   Conjunctiva pink.   Ears:  No scars, lesions or masses, Normal auditory acuity. Nose:  No deformity, discharge, or lesions. Mouth:  No deformity or lesions, oropharynx pink & moist.  Neck:  Supple; no masses, trachea midline  Respiratory: Normal respiratory effort  Gastrointestinal:  Soft, non-tender and non-distended without masses, hepatosplenomegaly or hernias noted.  No guarding or rebound tenderness.     Cardiac: No clubbing or edema.  No cyanosis. Normal posterior tibial pedal pulses noted.  Lymphatic:  No significant cervical adenopathy.  Psych:  Alert and cooperative. Normal mood and  affect.  Musculoskeletal:  Head normocephalic, atraumatic. 5/5 Lower extremity strength bilaterally.  Skin: Warm. Intact without significant lesions or rashes. No jaundice.  Neurologic:  Face symmetrical, tongue midline, Normal sensation to touch  Psych:  Alert and oriented x3, Alert and cooperative. Normal mood and affect.   Lab Results: Lab Results  Component Value Date   WBC 7.5 05/24/2021   HGB 9.2 (L) 05/24/2021   HCT 28.5 (L) 05/24/2021   MCV 86.9 05/24/2021   PLT 202 05/24/2021   Micro Results: Recent Results (from the past 240 hour(s))  Resp Panel by RT-PCR (Flu A&B, Covid) Nasopharyngeal Swab     Status: None   Collection Time: 05/20/21  6:17 PM   Specimen: Nasopharyngeal Swab; Nasopharyngeal(NP) swabs in vial transport medium  Result Value Ref Range Status   SARS Coronavirus 2 by RT PCR NEGATIVE NEGATIVE Final    Comment: (NOTE) SARS-CoV-2 target nucleic acids are NOT DETECTED.  The SARS-CoV-2 RNA is generally detectable in upper respiratory specimens during the acute phase of infection. The lowest concentration of SARS-CoV-2 viral copies this assay can detect is 138 copies/mL. A negative result does not preclude SARS-Cov-2 infection and should not be used as the sole basis for treatment or other patient management decisions. A negative result may occur with  improper specimen collection/handling, submission of specimen other than nasopharyngeal swab, presence of viral mutation(s)  within the areas targeted by this assay, and inadequate number of viral copies(<138 copies/mL). A negative result must be combined with clinical observations, patient history, and epidemiological information. The expected result is Negative.  Fact Sheet for Patients:  BloggerCourse.com  Fact Sheet for Healthcare Providers:  SeriousBroker.it  This test is no t yet approved or cleared by the [place] FDA and  has been authorized  for detection and/or diagnosis of SARS-CoV-2 by FDA under an Emergency Use Authorization (EUA). This EUA will remain  in effect (meaning this test can be used) for the duration of the COVID-19 declaration under Section 564(b)(1) of the Act, 21 U.S.C.section 360bbb-3(b)(1), unless the authorization is terminated  or revoked sooner.       Influenza A by PCR NEGATIVE NEGATIVE Final   Influenza B by PCR NEGATIVE NEGATIVE Final    Comment: (NOTE) The Xpert Xpress SARS-CoV-2/FLU/RSV plus assay is intended as an aid in the diagnosis of influenza from Nasopharyngeal swab specimens and should not be used as a sole basis for treatment. Nasal washings and aspirates are unacceptable for Xpert Xpress SARS-CoV-2/FLU/RSV testing.  Fact Sheet for Patients: BloggerCourse.com  Fact Sheet for Healthcare Providers: SeriousBroker.it  This test is not yet approved or cleared by the [place] FDA and has been authorized for detection and/or diagnosis of SARS-CoV-2 by FDA under an Emergency Use Authorization (EUA). This EUA will remain in effect (meaning this test can be used) for the duration of the COVID-19 declaration under Section 564(b)(1) of the Act, 21 U.S.C. section 360bbb-3(b)(1), unless the authorization is terminated or revoked.  Performed at [facility], [address], [place], [place] [address]    Studies/Results: No results found. Medications:  Scheduled Meds:  amLODipine  10 mg Oral Daily   atorvastatin  80 mg Oral QHS   carvedilol  25 mg Oral BID WC   dicyclomine  20 mg Oral TID AC & HS   DULoxetine  30 mg Oral Daily   hydrALAZINE  50 mg Oral Q8H   pantoprazole (PROTONIX) IV  40 mg Intravenous Q12H   polyethylene glycol-electrolytes  4,000 mL Oral Once   pregabalin  100 mg Oral TID   Continuous Infusions:  sodium chloride     sodium chloride     sodium chloride     cefTRIAXone (ROCEPHIN)  IV Stopped  (05/23/21 1250)   octreotide  (SANDOSTATIN)    IV infusion 50 mcg/hr (05/24/21 0636)   PRN Meds:.albuterol, hydrALAZINE, ondansetron (ZOFRAN) IV, oxyCODONE-acetaminophen, sodium chloride flush, traZODone   Assessment: Principal Problem:   Acute blood loss anemia Active Problems:   Liver cirrhosis (HCC)   Uncontrolled hypertension   PAD (peripheral artery disease) (HCC)   Acute-on-chronic kidney injury (HCC)   Chronic HFrEF (heart failure with reduced ejection fraction) (HCC)   COPD (chronic obstructive pulmonary disease) (HCC)   ABLA (acute blood loss anemia)    Plan: Patient's procedure had to be canceled today as she was eating solid food today even though she was supposed to be n.p.o. since this morning and was only supposed to be on clear liquids yesterday.  Therefore, colonoscopy prep has been reordered and patient was advised not to eat anything solid and only follow clear liquid diet today and she verbalized understanding of these instructions  Colonoscopy tomorrow if patient complies with prep and drinks at least half of it  I have discussed alternative options, risks & benefits,  which include, but are not limited to, bleeding, infection, perforation,respiratory complication & drug reaction.  The patient agrees with this plan & written consent will be obtained.    Since she has not had any evidence of active bleeding, and is hemodynamically stable, okay to wait for the procedure until tomorrow.  If active bleeding occurs, RBC scan may need to be considered.  Hemoglobin stable compared to yesterday    LOS: 3 days   Melodie Bouillon, MD 05/24/2021, 1:35 PM

## 2021-05-24 NOTE — Progress Notes (Signed)
PROGRESS NOTE    Caroline Smith  OVF:[id number] DOB: 1961-07-06 DOA: 05/20/2021 PCP: System, Provider Not In    Brief Narrative:  60 y.o. female with medical history significant for PVD, PAD, hypertension, cardiomyopathy with EF of 25%, asthma/COPD, cirrhosis with hepatitis C, history of GI bleed, CKD stage III, polysubstance abuse who presents with concerns of bleeding.  Patient reports having free-flowing vaginal bleeding for the past 2 days.  Has been using 3-4 pads daily. Last menstrual was when she was 60yo. Upon further questioning she is not sure if source of bleeding is vaginal or rectal   States last bowel movement was about 2 days ago and did not have blood when she wiped but noticed stool was more dark.  Notes diffuse abdominal pain.  Has nausea but no vomiting.  Denies any diarrhea. She does have a history of suspected GI bleed back in 2020 when she was on aspirin, Plavix and pletal. EGD at that time did not reveal source of bleeding.  Capsule study with nonbleeding AVMs.  Liver ultrasound negative for portal vein thrombus.  At that time her antiplatelets were stopped due to high risk of bleeding.    Patient received 1 unit packed red blood cells for hemoglobin 6.1.  Hemoglobin responded to 7.7.  Patient unclear whether the bleeding may be vaginal or rectal.  Vaginal ultrasound not indicative vaginal bleed.  GI consulted on 10/8.  Status post EGD on 10/9.  EGD normal.  Plan for colonoscopy on 10/10 however patient was unable to tolerate bowel prep.  GI aware.  Now plan for clear liquid diet today.  Reattempted bowel prep.    Reattempt C-scope on 10/11.   Assessment & Plan:   Principal Problem:   Acute blood loss anemia Active Problems:   Liver cirrhosis (HCC)   Uncontrolled hypertension   PAD (peripheral artery disease) (HCC)   Acute-on-chronic kidney injury (HCC)   Chronic HFrEF (heart failure with reduced ejection fraction) (HCC)   COPD (chronic obstructive pulmonary  disease) (HCC)   ABLA (acute blood loss anemia)   Acute on chronic blood loss anemia  Hgb of 6.1 from baseline around 11.   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   Last required transfusion back in April 2022 due to suspected bleeding AVMs and was transition to Plavix monotherapy. Transfuse 1 unit PRBC on admission for hemoglobin 6.1 Hemoglobin responded appropriately Still unclear whether the bleeding is vaginal or rectal Patient does not have periods Transvaginal ultrasound with simple fluid not convincing for blood, no endometrial thickening CT abdomen pelvis not possible due to renal insufficiency EGD 10/9, no bleeding Plan: No further transfusion at this time Continue twice daily PPI Continue octreotide drip Continue empiric Rocephin Monitor hemoglobin Continue holding Plavix Colonoscopy today  PAD  -hx of femoral endartectomy on Plavix due to hx of AVM bleed.  Continue holding Plavix   AKI on CKD stage IIIb -Creatinine progressively worsening -Baseline creatinine 1.57 -Creatinine 2.1 as of 10/11 Plan: Normal sinus 75 cc/h -Avoid nephrotoxins AM BMP   Hypertension Blood pressure remains elevated Continue Norvasc 10 mg daily Continue Coreg 25 mg twice daily Continue hydralazine 50 mg 3 times daily As needed IV hydralazine   HFrEF -last echo in 11/2020 with LV 25%.   - Monitor fluid status closely with transfusion -Continue Coreg, atorvastatin   Cirrhosis with history of treated hepatitis C - No liver decompensation on admission   COPD  -Continue  PRN bronchodilator  DVT prophylaxis: SCD Code Status: Full Family Communication: None today.  Offered to call but patient declined Disposition Plan: Status is: Inpatient  Remains inpatient appropriate because:Inpatient level of care appropriate due to severity of illness  Dispo: The patient is from: Home              Anticipated d/c is to: Home               Patient currently is not medically stable to d/c.   Difficult to place patient No             Level of care: Progressive Cardiac  Consultants:  GI  Procedures:  EGD 10/9  Antimicrobials: None   Subjective: Patient seen and examined.  Still running clear.  Continues to endorse abdominal pain.  N.p.o. for procedure.  Endorses hunger  Objective: Vitals:   05/23/21 2025 05/23/21 2321 05/24/21 0440 05/24/21 0724  BP: (!) 165/90 (!) 141/72 (!) 174/105 (!) 142/65  Pulse: 63 70 70 72  Resp: 18 16 16 18   Temp: 97.6 F (36.4 C) 98.2 F (36.8 C) 97.8 F (36.6 C) 98.4 F (36.9 C)  TempSrc:    Oral  SpO2: 100% 100% 100% 99%  Weight:      Height:        Intake/Output Summary (Last 24 hours) at 05/24/2021 1120 Last data filed at 05/24/2021 0440 Gross per 24 hour  Intake 890.9 ml  Output 100 ml  Net 790.9 ml   Filed Weights   05/20/21 1526 05/21/21 0241 05/22/21 1208  Weight: 51.7 kg 54.7 kg 54.7 kg    Examination:  General exam: Appears comfortable Respiratory system: Lungs clear.  Normal work of breathing.  Room air Cardiovascular system: S1-S2, RRR, no murmurs, no pedal edema Gastrointestinal system: Soft, NT/ND, normal bowel sounds Central nervous system: Alert and oriented. No focal neurological deficits. Extremities: Symmetric 5 x 5 power. Skin: No rashes, lesions or ulcers Psychiatry: Judgement and insight appear normal. Mood & affect appropriate.     Data Reviewed: I have personally reviewed following labs and imaging studies  CBC: Recent Labs  Lab 05/20/21 1529 05/21/21 0703 05/21/21 0854 05/23/21 0002 05/23/21 1154 05/23/21 1958 05/24/21 0427 05/24/21 0755  WBC 5.3 6.1  --   --   --   --   --  7.5  NEUTROABS  --   --   --   --   --   --   --  5.2  HGB 6.1* 7.7*   < > 9.8* 9.5* 9.0* 9.3* 9.4*  HCT 20.3* 24.0*  --   --   --   --   --  28.5*  MCV 87.5 86.0  --   --   --   --   --  86.9  PLT 170 236  --   --   --   --   --   202   < > = values in this interval not displayed.   Basic Metabolic Panel: Recent Labs  Lab 05/20/21 1529 05/21/21 0703 05/24/21 0755  NA 139 140 137  K 3.6 3.5 3.4*  CL 108 109 104  CO2 26 24 25   GLUCOSE 98 83 110*  BUN 16 16 11   CREATININE 1.88* 2.03* 2.11*  CALCIUM 8.2* 8.7* 8.4*   GFR: Estimated Creatinine Clearance: 25.1 mL/min (A) (by C-G formula based on SCr of 2.11 mg/dL (H)). Liver Function Tests: Recent Labs  Lab 05/21/21 0703  AST 20  ALT 10  ALKPHOS 119  BILITOT 0.5  PROT 6.4*  ALBUMIN 2.6*   No results for input(s): LIPASE, AMYLASE in the last 168 hours. No results for input(s): AMMONIA in the last 168 hours. Coagulation Profile: Recent Labs  Lab 05/20/21 1825  INR 1.1   Cardiac Enzymes: No results for input(s): CKTOTAL, CKMB, CKMBINDEX, TROPONINI in the last 168 hours. BNP (last 3 results) No results for input(s): PROBNP in the last 8760 hours. HbA1C: No results for input(s): HGBA1C in the last 72 hours. CBG: No results for input(s): GLUCAP in the last 168 hours. Lipid Profile: No results for input(s): CHOL, HDL, LDLCALC, TRIG, CHOLHDL, LDLDIRECT in the last 72 hours. Thyroid Function Tests: No results for input(s): TSH, T4TOTAL, FREET4, T3FREE, THYROIDAB in the last 72 hours. Anemia Panel: No results for input(s): VITAMINB12, FOLATE, FERRITIN, TIBC, IRON, RETICCTPCT in the last 72 hours. Sepsis Labs: No results for input(s): PROCALCITON, LATICACIDVEN in the last 168 hours.  Recent Results (from the past 240 hour(s))  Resp Panel by RT-PCR (Flu A&B, Covid) Nasopharyngeal Swab     Status: None   Collection Time: 05/20/21  6:17 PM   Specimen: Nasopharyngeal Swab; Nasopharyngeal(NP) swabs in vial transport medium  Result Value Ref Range Status   SARS Coronavirus 2 by RT PCR NEGATIVE NEGATIVE Final    Comment: (NOTE) SARS-CoV-2 target nucleic acids are NOT DETECTED.  The SARS-CoV-2 RNA is generally detectable in upper respiratory specimens  during the acute phase of infection. The lowest concentration of SARS-CoV-2 viral copies this assay can detect is 138 copies/mL. A negative result does not preclude SARS-Cov-2 infection and should not be used as the sole basis for treatment or other patient management decisions. A negative result may occur with  improper specimen collection/handling, submission of specimen other than nasopharyngeal swab, presence of viral mutation(s) within the areas targeted by this assay, and inadequate number of viral copies(<138 copies/mL). A negative result must be combined with clinical observations, patient history, and epidemiological information. The expected result is Negative.  Fact Sheet for Patients:  BloggerCourse.com  Fact Sheet for Healthcare Providers:  SeriousBroker.it  This test is no t yet approved or cleared by the [place] FDA and  has been authorized for detection and/or diagnosis of SARS-CoV-2 by FDA under an Emergency Use Authorization (EUA). This EUA will remain  in effect (meaning this test can be used) for the duration of the COVID-19 declaration under Section 564(b)(1) of the Act, 21 U.S.C.section 360bbb-3(b)(1), unless the authorization is terminated  or revoked sooner.       Influenza A by PCR NEGATIVE NEGATIVE Final   Influenza B by PCR NEGATIVE NEGATIVE Final    Comment: (NOTE) The Xpert Xpress SARS-CoV-2/FLU/RSV plus assay is intended as an aid in the diagnosis of influenza from Nasopharyngeal swab specimens and should not be used as a sole basis for treatment. Nasal washings and aspirates are unacceptable for Xpert Xpress SARS-CoV-2/FLU/RSV testing.  Fact Sheet for Patients: BloggerCourse.com  Fact Sheet for Healthcare Providers: SeriousBroker.it  This test is not yet approved or cleared by the [place] FDA and has been authorized for detection  and/or diagnosis of SARS-CoV-2 by FDA under an Emergency Use Authorization (EUA). This EUA will remain in effect (meaning this test can be used) for the duration of the COVID-19 declaration under Section 564(b)(1) of the Act, 21 U.S.C. section 360bbb-3(b)(1), unless the authorization is terminated or revoked.  Performed at [facility], [address] [place], [place] [address]  Radiology Studies: No results found.      Scheduled Meds:  amLODipine  10 mg Oral Daily   atorvastatin  80 mg Oral QHS   carvedilol  25 mg Oral BID WC   dicyclomine  20 mg Oral TID AC & HS   DULoxetine  30 mg Oral Daily   hydrALAZINE  50 mg Oral Q8H   pantoprazole (PROTONIX) IV  40 mg Intravenous Q12H   pregabalin  100 mg Oral TID   Continuous Infusions:  sodium chloride     sodium chloride     cefTRIAXone (ROCEPHIN)  IV Stopped (05/23/21 1250)   octreotide  (SANDOSTATIN)    IV infusion 50 mcg/hr (05/24/21 0636)     LOS: 3 days    Time spent: 25 minutes    Tresa Moore, MD [facility]   If 7PM-7AM, please contact night-coverage  05/24/2021, 11:20 AM

## 2021-05-24 NOTE — TOC Initial Note (Signed)
Transition of Care [facility]) - Initial/Assessment Note    Patient Details  Name: Caroline Smith MRN: [id number] Date of Birth: [birth year]  Transition of Care [facility]) CM/SW Contact:    Chapman Fitch, RN Phone Number: 05/24/2021, 12:42 PM  Clinical Narrative:                  Patient admitted from home with Acute on chronic blood loss anemia Patient states that she lives at home with daughter  PCP [facility]  Patient states "I have a lot of support" Patient states that she has Medicaid PCS services 7 days a week. 3 hours a day.  States that she is independent at baseline.  No TOC needs anticipated   Expected Discharge Plan: Home/Self Care Barriers to Discharge: Continued Medical Work up   Patient Goals and CMS Choice        Expected Discharge Plan and Services Expected Discharge Plan: Home/Self Care                                              Prior Living Arrangements/Services   Lives with:: Adult Children Patient language and need for interpreter reviewed:: Yes Do you feel safe going back to the place where you live?: Yes      Need for Family Participation in Patient Care: Yes (Comment) Care giver support system in place?: Yes (comment)   Criminal Activity/Legal Involvement Pertinent to Current Situation/Hospitalization: No - Comment as needed  Activities of Daily Living Home Assistive Devices/Equipment: None ADL Screening (condition at time of admission) Patient's cognitive ability adequate to safely complete daily activities?: Yes Is the patient deaf or have difficulty hearing?: No Does the patient have difficulty seeing, even when wearing glasses/contacts?: No Does the patient have difficulty concentrating, remembering, or making decisions?: No Patient able to express need for assistance with ADLs?: No Does the patient have difficulty dressing or bathing?: No Independently performs ADLs?: Yes (appropriate for developmental  age) Does the patient have difficulty walking or climbing stairs?: Yes Weakness of Legs: None Weakness of Arms/Hands: None  Permission Sought/Granted                  Emotional Assessment       Orientation: : Oriented to Self, Oriented to Place, Oriented to  Time, Oriented to Situation Alcohol / Substance Use: Not Applicable Psych Involvement: No (comment)  Admission diagnosis:  Acute blood loss anemia [D62] Vaginal bleeding [N93.9] Anemia, unspecified type [D64.9] ABLA (acute blood loss anemia) [D62] Patient Active Problem List   Diagnosis Date Noted   ABLA (acute blood loss anemia) 05/21/2021   Acute blood loss anemia 05/20/2021   PAD (peripheral artery disease) (HCC) 05/20/2021   Acute-on-chronic kidney injury (HCC) 05/20/2021   Chronic HFrEF (heart failure with reduced ejection fraction) (HCC) 05/20/2021   COPD (chronic obstructive pulmonary disease) (HCC) 05/20/2021   Uncontrolled hypertension    Dehydration    Generalized weakness    AKI (acute kidney injury) (HCC) 01/19/2021   Non-ischemic cardiomyopathy (HCC) 01/19/2021   Tobacco dependence 01/19/2021   Liver cirrhosis (HCC) 01/19/2021   CKD (chronic kidney disease) stage 3, GFR 30-59 ml/min (HCC) 01/19/2021   Gastroenteritis 01/19/2021   Depression 01/19/2021   Near syncope 01/19/2021   PCP:  Everlean Patterson, NP Pharmacy:   [facility] - [place], [place], [place] - [address] [place] [address] Phone: [phone] Fax: [phone]     Social Determinants of Health (SDOH) Interventions    Readmission Risk Interventions Readmission Risk Prevention Plan 05/24/2021  Transportation Screening Complete  Palliative Care Screening Not Applicable  Medication Review (RN Care Manager) Complete  Some recent data might be hidden

## 2021-05-25 ENCOUNTER — Inpatient Hospital Stay: Payer: Medicaid Other | Admitting: Certified Registered"

## 2021-05-25 ENCOUNTER — Encounter
Admission: EM | Disposition: A | Discharge status: Home / Self Care | Payer: Self-pay | Source: Home / Self Care | Attending: Internal Medicine

## 2021-05-25 DIAGNOSIS — K635 Polyp of colon: Secondary | ICD-10-CM

## 2021-05-25 DIAGNOSIS — K921 Melena: Secondary | ICD-10-CM | POA: Diagnosis not present

## 2021-05-25 DIAGNOSIS — D62 Acute posthemorrhagic anemia: Secondary | ICD-10-CM | POA: Diagnosis not present

## 2021-05-25 DIAGNOSIS — K625 Hemorrhage of anus and rectum: Secondary | ICD-10-CM

## 2021-05-25 HISTORY — PX: COLONOSCOPY WITH PROPOFOL: SHX5780

## 2021-05-25 LAB — CBC WITH DIFFERENTIAL/PLATELET
Abs Immature Granulocytes: 0.05 10*3/uL (ref 0.00–0.07)
Basophils Absolute: 0 10*3/uL (ref 0.0–0.1)
Basophils Relative: 0 %
Eosinophils Absolute: 0.2 10*3/uL (ref 0.0–0.5)
Eosinophils Relative: 2 %
HCT: 30.3 % — ABNORMAL LOW (ref 36.0–46.0)
Hemoglobin: 9.6 g/dL — ABNORMAL LOW (ref 12.0–15.0)
Immature Granulocytes: 1 %
Lymphocytes Relative: 24 %
Lymphs Abs: 1.7 10*3/uL (ref 0.7–4.0)
MCH: 27.6 pg (ref 26.0–34.0)
MCHC: 31.7 g/dL (ref 30.0–36.0)
MCV: 87.1 fL (ref 80.0–100.0)
Monocytes Absolute: 0.6 10*3/uL (ref 0.1–1.0)
Monocytes Relative: 8 %
Neutro Abs: 4.6 10*3/uL (ref 1.7–7.7)
Neutrophils Relative %: 65 %
Platelets: 206 10*3/uL (ref 150–400)
RBC: 3.48 MIL/uL — ABNORMAL LOW (ref 3.87–5.11)
RDW: 15.5 % (ref 11.5–15.5)
WBC: 7.1 10*3/uL (ref 4.0–10.5)
nRBC: 0 % (ref 0.0–0.2)

## 2021-05-25 LAB — BASIC METABOLIC PANEL
Anion gap: 6 (ref 5–15)
BUN: 10 mg/dL (ref 6–20)
CO2: 23 mmol/L (ref 22–32)
Calcium: 8.4 mg/dL — ABNORMAL LOW (ref 8.9–10.3)
Chloride: 109 mmol/L (ref 98–111)
Creatinine, Ser: 1.93 mg/dL — ABNORMAL HIGH (ref 0.44–1.00)
GFR, Estimated: 30 mL/min — ABNORMAL LOW (ref >60)
Glucose, Bld: 113 mg/dL — ABNORMAL HIGH (ref 70–99)
Potassium: 4.3 mmol/L (ref 3.5–5.1)
Sodium: 138 mmol/L (ref 135–145)

## 2021-05-25 LAB — HEMOGLOBIN
Hemoglobin: 10 g/dL — ABNORMAL LOW (ref 12.0–15.0)
Hemoglobin: 8.7 g/dL — ABNORMAL LOW (ref 12.0–15.0)

## 2021-05-25 SURGERY — COLONOSCOPY WITH PROPOFOL
Anesthesia: General

## 2021-05-25 MED ORDER — PHENYLEPHRINE HCL (PRESSORS) 10 MG/ML IV SOLN
INTRAVENOUS | Status: DC | PRN
  Administered 2021-05-25: 80 ug via INTRAVENOUS
  Administered 2021-05-25: 160 ug via INTRAVENOUS

## 2021-05-25 MED ORDER — EPHEDRINE SULFATE 50 MG/ML IJ SOLN
INTRAMUSCULAR | Status: DC | PRN
  Administered 2021-05-25: 5 mg via INTRAVENOUS

## 2021-05-25 MED ORDER — PROPOFOL 500 MG/50ML IV EMUL
INTRAVENOUS | Status: DC | PRN
  Administered 2021-05-25: 90 ug/kg/min via INTRAVENOUS

## 2021-05-25 MED ORDER — PROPOFOL 10 MG/ML IV BOLUS
INTRAVENOUS | Status: DC | PRN
  Administered 2021-05-25: 10 mg via INTRAVENOUS
  Administered 2021-05-25: 20 mg via INTRAVENOUS
  Administered 2021-05-25: 10 mg via INTRAVENOUS

## 2021-05-25 MED ORDER — LIDOCAINE HCL (CARDIAC) PF 100 MG/5ML IV SOSY
PREFILLED_SYRINGE | INTRAVENOUS | Status: DC | PRN
  Administered 2021-05-25: 40 mg via INTRAVENOUS

## 2021-05-25 MED ORDER — PHENYLEPHRINE HCL (PRESSORS) 10 MG/ML IV SOLN
INTRAVENOUS | Status: AC
  Filled 2021-05-25: qty 2

## 2021-05-25 NOTE — Progress Notes (Signed)
PROGRESS NOTE    Caroline Smith  BPZ:[id number] DOB: Nov 19, 1960 DOA: 05/20/2021 PCP: Everlean Patterson, NP    Brief Narrative:  60 y.o. female with medical history significant for PVD, PAD, hypertension, cardiomyopathy with EF of 25%, asthma/COPD, cirrhosis with hepatitis C, history of GI bleed, CKD stage III, polysubstance abuse who presents with concerns of bleeding.  Patient reports having free-flowing vaginal bleeding for the past 2 days.  Has been using 3-4 pads daily. Last menstrual was when she was 60yo. Upon further questioning she is not sure if source of bleeding is vaginal or rectal   States last bowel movement was about 2 days ago and did not have blood when she wiped but noticed stool was more dark.  Notes diffuse abdominal pain.  Has nausea but no vomiting.  Denies any diarrhea. She does have a history of suspected GI bleed back in 2020 when she was on aspirin, Plavix and pletal. EGD at that time did not reveal source of bleeding.  Capsule study with nonbleeding AVMs.  Liver ultrasound negative for portal vein thrombus.  At that time her antiplatelets were stopped due to high risk of bleeding.    Patient received 1 unit packed red blood cells for hemoglobin 6.1.  Hemoglobin responded to 7.7.  Patient unclear whether the bleeding may be vaginal or rectal.  Vaginal ultrasound not indicative vaginal bleed.  GI consulted on 10/8.  Status post EGD on 10/9.  EGD normal.  Plan for colonoscopy on 10/10 however patient was unable to tolerate bowel prep.  GI aware.  Now plan for clear liquid diet today.  Reattempted bowel prep.    Reattempt C-scope on 10/11.  10/12 plan for C-scope today as she patient reports taking all her prep today  Assessment & Plan:   Principal Problem:   Acute blood loss anemia Active Problems:   Liver cirrhosis (HCC)   Uncontrolled hypertension   PAD (peripheral artery disease) (HCC)   Acute-on-chronic kidney injury (HCC)   Chronic HFrEF (heart  failure with reduced ejection fraction) (HCC)   COPD (chronic obstructive pulmonary disease) (HCC)   ABLA (acute blood loss anemia)   Acute on chronic blood loss anemia  Hgb of 6.1 from baseline around 11.   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   Last required transfusion back in April 2022 due to suspected bleeding AVMs and was transition to Plavix monotherapy. Transfuse 1 unit PRBC on admission for hemoglobin 6.1 Hemoglobin responded appropriately Still unclear whether the bleeding is vaginal or rectal Patient does not have periods Transvaginal ultrasound with simple fluid not convincing for blood, no endometrial thickening CT abdomen pelvis not possible due to renal insufficiency EGD 10/9, no bleeding No further transfusion at this time Continue twice daily PPI Continue octreotide drip Continue empiric Rocephin Monitor hemoglobin Continue holding Plavix 10/12 colonoscopy today  PAD  -hx of femoral endartectomy on Plavix due to hx of AVM bleed.  10/12 continue holding Plavix   AKI on CKD stage IIIb -Creatinine progressively worsening -Baseline creatinine 1.57-2.0 10/12 improving close to baseline Avoid nephrotoxic medications    Hypertension BP still elevated After colonoscopy since getting anesthesia will make changes to her hydralazine by increasing it Continue Coreg Continue Norvasc    HFrEF -last echo in 11/2020 with LV 25%.   - Monitor fluid status closely with transfusion -Continue Coreg, atorvastatin   Cirrhosis with history of treated hepatitis C - No liver decompensation on  admission   COPD  -Continue PRN bronchodilator  DVT prophylaxis: SCD Code Status: Full Family Communication: None today.   Disposition Plan: Status is: Inpatient  Remains inpatient appropriate because:Inpatient level of care appropriate due to severity of illness  Dispo: The patient is from: Home               Anticipated d/c is to: Home              Patient currently is not medically stable to d/c.   Difficult to place patient No             Level of care: Progressive Cardiac  Consultants:  GI  Procedures:  EGD 10/9  Antimicrobials: None   Subjective: No sob, abd pain, cp.  Objective: Vitals:   05/24/21 1521 05/24/21 2207 05/25/21 0000 05/25/21 0421  BP: 134/75 (!) 152/99 (!) 146/91 (!) 170/96  Pulse: 66 63 70 72  Resp: 18 18 20 20   Temp:  97.6 F (36.4 C) 97.7 F (36.5 C) 97.6 F (36.4 C)  TempSrc:   Oral   SpO2: 100% 100% 98% 97%  Weight:      Height:        Intake/Output Summary (Last 24 hours) at 05/25/2021 0814 Last data filed at 05/24/2021 1358 Gross per 24 hour  Intake 212.7 ml  Output --  Net 212.7 ml   Filed Weights   05/20/21 1526 05/21/21 0241 05/22/21 1208  Weight: 51.7 kg 54.7 kg 54.7 kg    Examination: Nad, calm Cta no w/r/r Reg S1-S2 no gallops Soft benign positive bowel sounds No edema Grossly intact    Data Reviewed: I have personally reviewed following labs and imaging studies  CBC: Recent Labs  Lab 05/20/21 1529 05/21/21 0703 05/21/21 0854 05/24/21 0427 05/24/21 0755 05/24/21 1144 05/24/21 1945 05/25/21 0443  WBC 5.3 6.1  --   --  7.5  --   --  7.1  NEUTROABS  --   --   --   --  5.2  --   --  4.6  HGB 6.1* 7.7*   < > 9.3* 9.4* 9.2* 9.9* 9.6*  HCT 20.3* 24.0*  --   --  28.5*  --   --  30.3*  MCV 87.5 86.0  --   --  86.9  --   --  87.1  PLT 170 236  --   --  202  --   --  206   < > = values in this interval not displayed.   Basic Metabolic Panel: Recent Labs  Lab 05/20/21 1529 05/21/21 0703 05/24/21 0755 05/25/21 0443  NA 139 140 137 138  K 3.6 3.5 3.4* 4.3  CL 108 109 104 109  CO2 26 24 25 23   GLUCOSE 98 83 110* 113*  BUN 16 16 11 10   CREATININE 1.88* 2.03* 2.11* 1.93*  CALCIUM 8.2* 8.7* 8.4* 8.4*   GFR: Estimated Creatinine Clearance: 27.4 mL/min (A) (by C-G formula based on SCr of 1.93 mg/dL  (H)). Liver Function Tests: Recent Labs  Lab 05/21/21 0703  AST 20  ALT 10  ALKPHOS 119  BILITOT 0.5  PROT 6.4*  ALBUMIN 2.6*   No results for input(s): LIPASE, AMYLASE in the last 168 hours. No results for input(s): AMMONIA in the last 168 hours. Coagulation Profile: Recent Labs  Lab 05/20/21 1825  INR 1.1   Cardiac Enzymes: No results for input(s): CKTOTAL, CKMB, CKMBINDEX, TROPONINI in the last 168 hours. BNP (last 3 results) No  results for input(s): PROBNP in the last 8760 hours. HbA1C: No results for input(s): HGBA1C in the last 72 hours. CBG: No results for input(s): GLUCAP in the last 168 hours. Lipid Profile: No results for input(s): CHOL, HDL, LDLCALC, TRIG, CHOLHDL, LDLDIRECT in the last 72 hours. Thyroid Function Tests: No results for input(s): TSH, T4TOTAL, FREET4, T3FREE, THYROIDAB in the last 72 hours. Anemia Panel: No results for input(s): VITAMINB12, FOLATE, FERRITIN, TIBC, IRON, RETICCTPCT in the last 72 hours. Sepsis Labs: No results for input(s): PROCALCITON, LATICACIDVEN in the last 168 hours.  Recent Results (from the past 240 hour(s))  Resp Panel by RT-PCR (Flu A&B, Covid) Nasopharyngeal Swab     Status: None   Collection Time: 05/20/21  6:17 PM   Specimen: Nasopharyngeal Swab; Nasopharyngeal(NP) swabs in vial transport medium  Result Value Ref Range Status   SARS Coronavirus 2 by RT PCR NEGATIVE NEGATIVE Final    Comment: (NOTE) SARS-CoV-2 target nucleic acids are NOT DETECTED.  The SARS-CoV-2 RNA is generally detectable in upper respiratory specimens during the acute phase of infection. The lowest concentration of SARS-CoV-2 viral copies this assay can detect is 138 copies/mL. A negative result does not preclude SARS-Cov-2 infection and should not be used as the sole basis for treatment or other patient management decisions. A negative result may occur with  improper specimen collection/handling, submission of specimen other than  nasopharyngeal swab, presence of viral mutation(s) within the areas targeted by this assay, and inadequate number of viral copies(<138 copies/mL). A negative result must be combined with clinical observations, patient history, and epidemiological information. The expected result is Negative.  Fact Sheet for Patients:  BloggerCourse.com  Fact Sheet for Healthcare Providers:  SeriousBroker.it  This test is no t yet approved or cleared by the [place] FDA and  has been authorized for detection and/or diagnosis of SARS-CoV-2 by FDA under an Emergency Use Authorization (EUA). This EUA will remain  in effect (meaning this test can be used) for the duration of the COVID-19 declaration under Section 564(b)(1) of the Act, 21 U.S.C.section 360bbb-3(b)(1), unless the authorization is terminated  or revoked sooner.       Influenza A by PCR NEGATIVE NEGATIVE Final   Influenza B by PCR NEGATIVE NEGATIVE Final    Comment: (NOTE) The Xpert Xpress SARS-CoV-2/FLU/RSV plus assay is intended as an aid in the diagnosis of influenza from Nasopharyngeal swab specimens and should not be used as a sole basis for treatment. Nasal washings and aspirates are unacceptable for Xpert Xpress SARS-CoV-2/FLU/RSV testing.  Fact Sheet for Patients: BloggerCourse.com  Fact Sheet for Healthcare Providers: SeriousBroker.it  This test is not yet approved or cleared by the [place] FDA and has been authorized for detection and/or diagnosis of SARS-CoV-2 by FDA under an Emergency Use Authorization (EUA). This EUA will remain in effect (meaning this test can be used) for the duration of the COVID-19 declaration under Section 564(b)(1) of the Act, 21 U.S.C. section 360bbb-3(b)(1), unless the authorization is terminated or revoked.  Performed at [facility], [address] [place], [place]  [address]          Radiology Studies: No results found.      Scheduled Meds:  amLODipine  10 mg Oral Daily   atorvastatin  80 mg Oral QHS   carvedilol  25 mg Oral BID WC   dicyclomine  20 mg Oral TID AC & HS   DULoxetine  30 mg Oral Daily   hydrALAZINE  50 mg Oral Q8H  pantoprazole (PROTONIX) IV  40 mg Intravenous Q12H   pregabalin  100 mg Oral TID   Continuous Infusions:  sodium chloride     sodium chloride     sodium chloride 75 mL/hr at 05/24/21 1357   cefTRIAXone (ROCEPHIN)  IV 2 g (05/24/21 1358)   octreotide  (SANDOSTATIN)    IV infusion 50 mcg/hr (05/24/21 2215)     LOS: 4 days    Time spent: 35 minutes with more than 50% on COC    Lynn Ito, MD [facility]   If 7PM-7AM, please contact night-coverage  05/25/2021, 8:14 AM

## 2021-05-25 NOTE — Op Note (Signed)
[facility] Patient Name: Caroline Smith Procedure Date: 05/25/2021 12:06 PM MRN: [id number] Account #: [id number] Date of Birth: Jul 31, 1961 Admit Type: Outpatient Age: 60 Room: [facility] ENDO ROOM 3 Gender: Female Note Status: Finalized Instrument Name: Prentice Docker [id number] Procedure:             Colonoscopy Indications:           Hematochezia Providers:             Jaydi Bray B. Maximino Greenland MD, MD Medicines:             Monitored Anesthesia Care Complications:         No immediate complications. Procedure:             Pre-Anesthesia Assessment:                        - ASA Grade Assessment: III - A patient with severe                         systemic disease.                        - Prior to the procedure, a History and Physical was                         performed, and patient medications, allergies and                         sensitivities were reviewed. The patient's tolerance                         of previous anesthesia was reviewed.                        - The risks and benefits of the procedure and the                         sedation options and risks were discussed with the                         patient. All questions were answered and informed                         consent was obtained.                        - Patient identification and proposed procedure were                         verified prior to the procedure by the physician, the                         nurse, the anesthesiologist, the anesthetist and the                         technician. The procedure was verified in the                         procedure room.  After obtaining informed consent, the colonoscope was                         passed under direct vision. Throughout the procedure,                         the patient's blood pressure, pulse, and oxygen                         saturations were monitored continuously. The                          Colonoscope was introduced through the anus and                         advanced to the the cecum, identified by appendiceal                         orifice and ileocecal valve. The colonoscopy was                         performed with ease. The patient tolerated the                         procedure well. The quality of the bowel preparation                         was fair. Findings:      The perianal and digital rectal examinations were normal.      A 10 mm polyp was found in the ascending colon. The polyp was       semi-pedunculated. The polyp was removed with a cold snare. Resection       and retrieval were complete. For hemostasis, two hemostatic clips were       successfully placed. There was no bleeding at the end of the procedure.      The exam was otherwise without abnormality.      The retroflexed view of the distal rectum and anal verge was normal and       showed no anal or rectal abnormalities. Impression:            - Preparation of the colon was fair.                        - One 10 mm polyp in the ascending colon, removed with                         a cold snare. Resected and retrieved. Clips were                         placed.                        - The examination was otherwise normal.                        - The distal rectum and anal verge are normal on  retroflexion view. Recommendation:        - Await pathology results.                        - To visualize the small bowel, perform video capsule                         endoscopy today.                        - Diet per Small bowel capsule protocol.                        - Return patient to hospital ward for ongoing care.                        - Continue present medications.                        - The findings and recommendations were discussed with                         the patient.                        - Return to primary care physician as previously                          scheduled. Procedure Code(s):     --- Professional ---                        [phone], Colonoscopy, flexible; with removal of                         tumor(s), polyp(s), or other lesion(s) by snare                         technique Diagnosis Code(s):     --- Professional ---                        K63.5, Polyp of colon                        K92.1, Melena (includes Hematochezia) CPT copyright 2019 American Medical Association. All rights reserved. The codes documented in this report are preliminary and upon coder review may  be revised to meet current compliance requirements.  Melodie Bouillon, MD Michel Bickers B. Maximino Greenland MD, MD 05/25/2021 1:08:42 PM This report has been signed electronically. Number of Addenda: 0 Note Initiated On: 05/25/2021 12:06 PM Scope Withdrawal Time: 0 hours 18 minutes 37 seconds  Total Procedure Duration: 0 hours 25 minutes 18 seconds  Estimated Blood Loss:  Estimated blood loss: none.      [facility]

## 2021-05-25 NOTE — Anesthesia Postprocedure Evaluation (Signed)
Anesthesia Post Note  Patient: Caroline Smith  Procedure(s) Performed: COLONOSCOPY WITH PROPOFOL  Patient location during evaluation: PACU Anesthesia Type: General Level of consciousness: awake and alert, oriented and patient cooperative Pain management: pain level controlled Vital Signs Assessment: post-procedure vital signs reviewed and stable Respiratory status: spontaneous breathing, nonlabored ventilation and respiratory function stable Cardiovascular status: blood pressure returned to baseline and stable Postop Assessment: adequate PO intake Anesthetic complications: no   No notable events documented.   Last Vitals:  Vitals:   05/25/21 1313 05/25/21 1323  BP: (!) 169/95 (!) 168/78  Pulse: 73   Resp:  20  Temp:    SpO2: 96%     Last Pain:  Vitals:   05/25/21 1323  TempSrc:   PainSc: 0-No pain                 Reed Breech

## 2021-05-25 NOTE — Anesthesia Preprocedure Evaluation (Addendum)
Anesthesia Evaluation  Patient identified by MRN, date of birth, ID band Patient awake    Reviewed: Allergy & Precautions, NPO status , Patient's Chart, lab work & pertinent test results  History of Anesthesia Complications Negative for: history of anesthetic complications  Airway Mallampati: IV   Neck ROM: Full    Dental  (+) Poor Dentition   Pulmonary COPD, Current Smoker (4 cigarettes per day) and Patient abstained from smoking.,    Pulmonary exam normal breath sounds clear to auscultation       Cardiovascular hypertension, + Peripheral Vascular Disease (hx femoral endarterectomy on Plavix) and +CHF (NICM, EF 25%)  Normal cardiovascular exam Rhythm:Regular Rate:Normal     Neuro/Psych PSYCHIATRIC DISORDERS Anxiety Depression Polysubstance use disorder: cocaine (last used 2 months ago), alcohol (1 month ago), marijuana   GI/Hepatic GERD  ,(+) Cirrhosis       , Hepatitis -, C  Endo/Other    Renal/GU Renal disease (stage III CKD)     Musculoskeletal   Abdominal   Peds  Hematology  (+) Blood dyscrasia (acute blood loss), anemia ,   Anesthesia Other Findings Cardiology note 01/12/21:  Assessment and Plan   60 y.o. female with  1. Heart failure with reduced left ventricular function (CMS-HCC)  -No evidence of CAD per [facility] on 12/16/20. Will continue GDMT of carvedilol 25mg  BID, Lasix 40mg  once daily, hydralazine 50mg  BID, and Imdur 30mg  once daily. Jardiance recently added at 10mg . 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 Will defer use of ACE/ARB/Entresto/Spironolactone due to renal function. Patient is euvolemic on exam today. Will continue current medications as discussed above  2. Atherosclerotic PVD with intermittent claudication (CMS-HCC)  -Continue Plavix 75mg  once daily and atorvastatin 80mg  once daily  3.  Unstable angina (CMS-HCC)  -Resolved with no evidence of CAD per [facility]  4. COPD with asthma , unspecified (CMS-HCC)  -Continue current medications and routine follow up with PCP  5. Hypercholesteremia  -Continue atorvastatin 80mg  daily  6. Hx of polysubstance abuse: cocaine, ETOH, marijuana    Reproductive/Obstetrics                            Anesthesia Physical Anesthesia Plan  ASA: 4  Anesthesia Plan: General   Post-op Pain Management:    Induction: Intravenous  PONV Risk Score and Plan: 2 and Propofol infusion, TIVA and Treatment may vary due to age or medical condition  Airway Management Planned: Natural Airway  Additional Equipment:   Intra-op Plan:   Post-operative Plan:   Informed Consent: I have reviewed the patients History and Physical, chart, labs and discussed the procedure including the risks, benefits and alternatives for the proposed anesthesia with the patient or authorized representative who has indicated his/her understanding and acceptance.       Plan Discussed with: CRNA  Anesthesia Plan Comments:        Anesthesia Quick Evaluation

## 2021-05-25 NOTE — Transfer of Care (Signed)
Immediate Anesthesia Transfer of Care Note  Patient: Caroline Smith  Procedure(s) Performed: COLONOSCOPY WITH PROPOFOL  Patient Location: PACU and Endoscopy Unit  Anesthesia Type:General  Level of Consciousness: drowsy  Airway & Oxygen Therapy: Patient Spontanous Breathing  Post-op Assessment: Report given to RN and Post -op Vital signs reviewed and stable  Post vital signs: Reviewed and stable  Last Vitals:  Vitals Value Taken Time  BP    Temp    Pulse    Resp    SpO2      Last Pain:  Vitals:   05/25/21 0814  TempSrc: Oral  PainSc: 0-No pain      Patients Stated Pain Goal: 0 (05/23/21 1200)  Complications: No notable events documented.

## 2021-05-26 ENCOUNTER — Encounter: Payer: Self-pay | Admitting: Gastroenterology

## 2021-05-26 DIAGNOSIS — K921 Melena: Secondary | ICD-10-CM | POA: Diagnosis not present

## 2021-05-26 DIAGNOSIS — D509 Iron deficiency anemia, unspecified: Secondary | ICD-10-CM | POA: Diagnosis not present

## 2021-05-26 LAB — HEMOGLOBIN
Hemoglobin: 9.3 g/dL — ABNORMAL LOW (ref 12.0–15.0)
Hemoglobin: 9.9 g/dL — ABNORMAL LOW (ref 12.0–15.0)
Hemoglobin: 9.2 g/dL — ABNORMAL LOW (ref 12.0–15.0)

## 2021-05-26 LAB — CREATININE, SERUM
Creatinine, Ser: 2.15 mg/dL — ABNORMAL HIGH (ref 0.44–1.00)
GFR, Estimated: 26 mL/min — ABNORMAL LOW (ref >60)

## 2021-05-26 LAB — SURGICAL PATHOLOGY

## 2021-05-26 NOTE — Consult Note (Addendum)
[facility]  CONSULT NOTE    Date: 05/26/2021                  Patient Name:  Caroline Smith  MRN: [id number]  DOB: 01/24/1961  Age / Sex: 60 y.o., female         PCP: Everlean Patterson, NP                 Service Requesting Consult: TRH                 Reason for Consult: Acute kidney injury            History of Present Illness: Ms. Caroline Smith is a 60 y.o.  female with medical conditions including GERD, anxiety, PAD, cirrhosis with Hep C, Bronchitis and chronic kidney disease stage 3B, who was admitted to [facility] on 05/20/2021 for Acute blood loss anemia [D62] Vaginal bleeding [N93.9] Anemia, unspecified type [D64.9] ABLA (acute blood loss anemia) [D62]  Patient is currently followed by [facility] and states she recently had an appt about a month ago. She presents to the ED with concerns for vaginal bleeding. States she's been using 3-4 pads daily. States she has been menopausal since age 12. Denies dark or bloody stools, and diarrhea. Originally had abdominal pain, but not currently. Hemoglobin 6.1 on admission. We have been consulted to evaluate acute kidney injury.   Medications: Outpatient medications: Medications Prior to Admission  Medication Sig Dispense Refill Last Dose   acetaminophen (TYLENOL) 500 MG tablet Take 1,000 mg by mouth every 6 (six) hours as needed for mild pain.   prn at prn   albuterol (PROVENTIL) (2.5 MG/3ML) 0.083% nebulizer solution Inhale 3 mLs into the lungs every 6 (six) hours as needed for wheezing.   prn at prn   ALLERGY RELIEF 10 MG tablet Take 10 mg by mouth daily.   05/20/2021   aluminum-magnesium hydroxide-simethicone (MAALOX) 200-200-20 MG/5ML SUSP Take 30 mLs by mouth 4 (four) times daily -  before meals and at bedtime. 355 mL 0 05/20/2021   amLODipine (NORVASC) 10 MG tablet Take 10 mg by mouth daily.   05/20/2021   ASPIRIN LOW DOSE 81 MG EC tablet Take 81 mg by mouth daily.   05/20/2021    atorvastatin (LIPITOR) 80 MG tablet Take 80 mg by mouth at bedtime.   05/19/2021   carvedilol (COREG) 25 MG tablet Take 1 tablet (25 mg total) by mouth 2 (two) times daily with a meal. 60 tablet 0 05/20/2021   clopidogrel (PLAVIX) 75 MG tablet Take 75 mg by mouth daily.   05/20/2021   DULoxetine (CYMBALTA) 30 MG capsule Take 30 mg by mouth daily.   05/20/2021   furosemide (LASIX) 40 MG tablet Take 40 mg by mouth daily as needed.   05/20/2021   gabapentin (NEURONTIN) 100 MG capsule Take 100 mg by mouth 3 (three) times daily.   05/20/2021   JARDIANCE 10 MG TABS tablet Take 10 mg by mouth daily.   05/20/2021   lisinopril (ZESTRIL) 20 MG tablet Take 20 mg by mouth daily.   05/20/2021   omeprazole (PRILOSEC) 20 MG capsule Take 20 mg by mouth daily.   05/20/2021   ondansetron (ZOFRAN) 4 MG tablet Take 4 mg by mouth every 8 (eight) hours as needed for nausea or vomiting.   prn at prn   oxyCODONE-acetaminophen (PERCOCET/ROXICET) 5-325 MG tablet Take 1 tablet by mouth every 8 (eight) hours as needed.   prn  at prn   pregabalin (LYRICA) 100 MG capsule Take 100 mg by mouth 3 (three) times daily.   05/20/2021   PROAIR HFA 108 (90 Base) MCG/ACT inhaler Inhale 2 puffs into the lungs every 6 (six) hours as needed for wheezing or shortness of breath.   prn at prn   traZODone (DESYREL) 50 MG tablet Take 50 mg by mouth at bedtime as needed for sleep.   prn at prn    Current medications: Current Facility-Administered Medications  Medication Dose Route Frequency Provider Last Rate Last Admin   0.9 %  sodium chloride infusion  10 mL/hr Intravenous Once Phineas Semen, MD       0.9 %  sodium chloride infusion   Intravenous Continuous Wyline Mood, MD       albuterol (PROVENTIL) (2.5 MG/3ML) 0.083% nebulizer solution 3 mL  3 mL Inhalation Q6H PRN Tu, Ching T, DO       amLODipine (NORVASC) tablet 10 mg  10 mg Oral Daily Tu, Ching T, DO   10 mg at 05/26/21 0850   atorvastatin (LIPITOR) tablet 80 mg  80 mg Oral QHS Tu, Ching T,  DO   80 mg at 05/25/21 2145   carvedilol (COREG) tablet 25 mg  25 mg Oral BID WC Tu, Ching T, DO   25 mg at 05/26/21 0851   cefTRIAXone (ROCEPHIN) 2 g in sodium chloride 0.9 % 100 mL IVPB  2 g Intravenous Q24H Sreenath, Sudheer B, MD 200 mL/hr at 05/26/21 1134 2 g at 05/26/21 1134   dicyclomine (BENTYL) tablet 20 mg  20 mg Oral TID AC & HS Sreenath, Sudheer B, MD   20 mg at 05/26/21 1134   DULoxetine (CYMBALTA) DR capsule 30 mg  30 mg Oral Daily Tu, Ching T, DO   30 mg at 05/26/21 0851   hydrALAZINE (APRESOLINE) injection 10 mg  10 mg Intravenous Q4H PRN Manuela Schwartz, NP   10 mg at 05/22/21 2038   hydrALAZINE (APRESOLINE) tablet 50 mg  50 mg Oral Q8H Sreenath, Sudheer B, MD   50 mg at 05/26/21 0526   octreotide (SANDOSTATIN) 500 mcg in sodium chloride 0.9 % 250 mL (2 mcg/mL) infusion  50 mcg/hr Intravenous Continuous Georgeann Oppenheim, Sudheer B, MD 25 mL/hr at 05/26/21 1100 50 mcg/hr at 05/26/21 1100   ondansetron (ZOFRAN) injection 4 mg  4 mg Intravenous Q4H PRN Mansy, Jan A, MD   4 mg at 05/25/21 0134   oxyCODONE-acetaminophen (PERCOCET/ROXICET) 5-325 MG per tablet 1 tablet  1 tablet Oral Q4H PRN Lolita Patella B, MD   1 tablet at 05/26/21 0903   pantoprazole (PROTONIX) injection 40 mg  40 mg Intravenous Q12H Sreenath, Sudheer B, MD   40 mg at 05/26/21 0850   pregabalin (LYRICA) capsule 100 mg  100 mg Oral TID Tu, Ching T, DO   100 mg at 05/26/21 0850   sodium chloride flush (NS) 0.9 % injection 10-40 mL  10-40 mL Intracatheter PRN Lolita Patella B, MD       traZODone (DESYREL) tablet 50 mg  50 mg Oral QHS PRN Tu, Ching T, DO          Allergies: Allergies  Allergen Reactions   Tramadol Other (See Comments) and Itching    Reaction:  Gave pt anxiety  "nervous feeling" Makes her nervous. Denied itching, rash, swelling, or difficulty breathing when she took this medicine.  07/31/19 states took 3 weeks ago did not cause itching or making her nervous it just did not help Reaction:  Gave pt  anxiety  Makes her nervous "nervous feeling" Makes her nervous. Denied itching, rash, swelling, or difficulty breathing when she took this medicine.  07/31/19 states took 3 weeks ago did not cause itching or making her nervous it just did not help       Past Medical History: Past Medical History:  Diagnosis Date   Anxiety    Bronchitis    GERD (gastroesophageal reflux disease)    Hypertension      Past Surgical History: History reviewed. No pertinent surgical history.   Family History: Family History  Problem Relation Age of Onset   Hypertension Mother      Social History: Social History   Socioeconomic History   Marital status: Single    Spouse name: Not on file   Number of children: Not on file   Years of education: Not on file   Highest education level: Not on file  Occupational History   Not on file  Tobacco Use   Smoking status: Every Day    Types: Cigarettes   Smokeless tobacco: Never  Vaping Use   Vaping Use: Never used  Substance and Sexual Activity   Alcohol use: No   Drug use: No   Sexual activity: Not on file  Other Topics Concern   Not on file  Social History Narrative   Not on file   Social Determinants of Health   Financial Resource Strain: Not on file  Food Insecurity: Not on file  Transportation Needs: Not on file  Physical Activity: Not on file  Stress: Not on file  Social Connections: Not on file  Intimate Partner Violence: Not on file     Review of Systems: Review of Systems  Gastrointestinal:  Positive for abdominal pain and nausea.  All other systems reviewed and are negative.  Vital Signs: Blood pressure (!) 152/96, pulse 72, temperature 98.5 F (36.9 C), temperature source Oral, resp. rate 18, height 5\' 7"  (1.702 m), weight 54.7 kg, last menstrual period 12/26/2012, SpO2 100 %.  Weight trends: Filed Weights   05/20/21 1526 05/21/21 0241 05/22/21 1208  Weight: 51.7 kg 54.7 kg 54.7 kg    Physical Exam: General:  NAD  Head: Normocephalic, atraumatic. Moist oral mucosal membranes  Eyes: Anicteric  Lungs:  Clear to auscultation, normal effort  Heart: Regular rate and rhythm  Abdomen:  Soft, nontender, nondistended  Extremities:  no peripheral edema.  Neurologic: Nonfocal, moving all four extremities  Skin: No lesions        Lab results: Basic Metabolic Panel: Recent Labs  Lab 05/21/21 0703 05/24/21 0755 05/25/21 0443 05/26/21 0545  NA 140 137 138  --   K 3.5 3.4* 4.3  --   CL 109 104 109  --   CO2 24 25 23   --   GLUCOSE 83 110* 113*  --   BUN 16 11 10   --   CREATININE 2.03* 2.11* 1.93* 2.15*  CALCIUM 8.7* 8.4* 8.4*  --     Liver Function Tests: Recent Labs  Lab 05/21/21 0703  AST 20  ALT 10  ALKPHOS 119  BILITOT 0.5  PROT 6.4*  ALBUMIN 2.6*   No results for input(s): LIPASE, AMYLASE in the last 168 hours. No results for input(s): AMMONIA in the last 168 hours.  CBC: Recent Labs  Lab 05/20/21 1529 05/21/21 0703 05/21/21 0854 05/24/21 0755 05/24/21 1144 05/25/21 0443 05/25/21 1511 05/25/21 1930 05/26/21 0545 05/26/21 1157  WBC 5.3 6.1  --  7.5  --  7.1  --   --   --   --  NEUTROABS  --   --   --  5.2  --  4.6  --   --   --   --   HGB 6.1* 7.7*   < > 9.4*   < > 9.6*   < > 8.7* 9.3* 9.9*  HCT 20.3* 24.0*  --  28.5*  --  30.3*  --   --   --   --   MCV 87.5 86.0  --  86.9  --  87.1  --   --   --   --   PLT 170 236  --  202  --  206  --   --   --   --    < > = values in this interval not displayed.    Cardiac Enzymes: No results for input(s): CKTOTAL, CKMB, CKMBINDEX, TROPONINI in the last 168 hours.  BNP: Invalid input(s): POCBNP  CBG: No results for input(s): GLUCAP in the last 168 hours.  Microbiology: Results for orders placed or performed during the hospital encounter of 05/20/21  Resp Panel by RT-PCR (Flu A&B, Covid) Nasopharyngeal Swab     Status: None   Collection Time: 05/20/21  6:17 PM   Specimen: Nasopharyngeal Swab; Nasopharyngeal(NP) swabs  in vial transport medium  Result Value Ref Range Status   SARS Coronavirus 2 by RT PCR NEGATIVE NEGATIVE Final    Comment: (NOTE) SARS-CoV-2 target nucleic acids are NOT DETECTED.  The SARS-CoV-2 RNA is generally detectable in upper respiratory specimens during the acute phase of infection. The lowest concentration of SARS-CoV-2 viral copies this assay can detect is 138 copies/mL. A negative result does not preclude SARS-Cov-2 infection and should not be used as the sole basis for treatment or other patient management decisions. A negative result may occur with  improper specimen collection/handling, submission of specimen other than nasopharyngeal swab, presence of viral mutation(s) within the areas targeted by this assay, and inadequate number of viral copies(<138 copies/mL). A negative result must be combined with clinical observations, patient history, and epidemiological information. The expected result is Negative.  Fact Sheet for Patients:  BloggerCourse.com  Fact Sheet for Healthcare Providers:  SeriousBroker.it  This test is no t yet approved or cleared by the [place] FDA and  has been authorized for detection and/or diagnosis of SARS-CoV-2 by FDA under an Emergency Use Authorization (EUA). This EUA will remain  in effect (meaning this test can be used) for the duration of the COVID-19 declaration under Section 564(b)(1) of the Act, 21 U.S.C.section 360bbb-3(b)(1), unless the authorization is terminated  or revoked sooner.       Influenza A by PCR NEGATIVE NEGATIVE Final   Influenza B by PCR NEGATIVE NEGATIVE Final    Comment: (NOTE) The Xpert Xpress SARS-CoV-2/FLU/RSV plus assay is intended as an aid in the diagnosis of influenza from Nasopharyngeal swab specimens and should not be used as a sole basis for treatment. Nasal washings and aspirates are unacceptable for Xpert Xpress  SARS-CoV-2/FLU/RSV testing.  Fact Sheet for Patients: BloggerCourse.com  Fact Sheet for Healthcare Providers: SeriousBroker.it  This test is not yet approved or cleared by the [place] FDA and has been authorized for detection and/or diagnosis of SARS-CoV-2 by FDA under an Emergency Use Authorization (EUA). This EUA will remain in effect (meaning this test can be used) for the duration of the COVID-19 declaration under Section 564(b)(1) of the Act, 21 U.S.C. section 360bbb-3(b)(1), unless the authorization is terminated or revoked.  Performed at [facility], [address] [place], [place] [address]     Coagulation Studies: No results for input(s): LABPROT, INR in the last 72 hours.  Urinalysis: No results for input(s): COLORURINE, LABSPEC, PHURINE, GLUCOSEU, HGBUR, BILIRUBINUR, KETONESUR, PROTEINUR, UROBILINOGEN, NITRITE, LEUKOCYTESUR in the last 72 hours.  Invalid input(s): APPERANCEUR    Imaging: No results found.   Assessment & Plan: Ms. Caroline Smith is a 60 y.o.  female with medical conditions including GERD, anxiety, PAD, cirrhosis with Hep C, Bronchitis and chronic kidney disease stage 3B, who was admitted to [facility] on 05/20/2021 for Acute blood loss anemia [D62] Vaginal bleeding [N93.9] Anemia, unspecified type [D64.9] ABLA (acute blood loss anemia) [D62]  Acute Kidney Injury on chronic kidney disease stage 3b with baseline creatinine 1.88 and GFR of 31 on 05/20/21.  Acute kidney injury secondary to prerenal azotemia due to acute blood loss Chronic kidney disease is secondary to hypertension No IV contrast exposure, no acute need for dialysis Current level 2.15. Will suggest patient follow up with nephrology after discharge.   2. Anemia of chronic kidney disease Lab Results  Component Value Date   HGB 9.9 (L) 05/26/2021   Hgb stable at this time Flex Sigmoidoscopy performed on 05/22/21  noted blood in rectum, sigmoid colon and descending colon.  Colonoscopy completed by GI on 05/25/21, 1 polyp removed, no active bleeding noted. Planned capsule study today    LOS: 5   10/13/20221:33 PM

## 2021-05-26 NOTE — Progress Notes (Signed)
PROGRESS NOTE    Caroline Smith  PJA:[id number] DOB: 1961/04/14 DOA: 05/20/2021 PCP: Everlean Patterson, NP    Brief Narrative:  60 y.o. female with medical history significant for PVD, PAD, hypertension, cardiomyopathy with EF of 25%, asthma/COPD, cirrhosis with hepatitis C, history of GI bleed, CKD stage III, polysubstance abuse who presents with concerns of bleeding.  Patient reports having free-flowing vaginal bleeding for the past 2 days.  Has been using 3-4 pads daily. Last menstrual was when she was 60yo. Upon further questioning she is not sure if source of bleeding is vaginal or rectal   States last bowel movement was about 2 days ago and did not have blood when she wiped but noticed stool was more dark.  Notes diffuse abdominal pain.  Has nausea but no vomiting.  Denies any diarrhea. She does have a history of suspected GI bleed back in 2020 when she was on aspirin, Plavix and pletal. EGD at that time did not reveal source of bleeding.  Capsule study with nonbleeding AVMs.  Liver ultrasound negative for portal vein thrombus.  At that time her antiplatelets were stopped due to high risk of bleeding.    Patient received 1 unit packed red blood cells for hemoglobin 6.1.  Hemoglobin responded to 7.7.  Patient unclear whether the bleeding may be vaginal or rectal.  Vaginal ultrasound not indicative vaginal bleed.  GI consulted on 10/8.  Status post EGD on 10/9.  EGD normal.  Plan for colonoscopy on 10/10 however patient was unable to tolerate bowel prep.  GI aware.  Now plan for clear liquid diet today.  Reattempted bowel prep.    Reattempt C-scope on 10/11.  10/12 plan for C-scope today as she patient reports taking all her prep today 10/13 pt has no complaints this am. Spoke to GI, will do another EGD to make sure no bleeding to make sure ok to resume plavix then.  Assessment & Plan:   Principal Problem:   Acute blood loss anemia Active Problems:   Liver cirrhosis (HCC)    Uncontrolled hypertension   PAD (peripheral artery disease) (HCC)   Acute-on-chronic kidney injury (HCC)   Chronic HFrEF (heart failure with reduced ejection fraction) (HCC)   COPD (chronic obstructive pulmonary disease) (HCC)   ABLA (acute blood loss anemia)   Hematochezia   Polyp of ascending colon   Acute on chronic blood loss anemia  Hgb of 6.1 from baseline around 11.   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   Last required transfusion back in April 2022 due to suspected bleeding AVMs and was transition to Plavix monotherapy. Transfuse 1 unit PRBC on admission for hemoglobin 6.1 Hemoglobin responded appropriately Still unclear whether the bleeding is vaginal or rectal Patient does not have periods Transvaginal ultrasound with simple fluid not convincing for blood, no endometrial thickening CT abdomen pelvis not possible due to renal insufficiency EGD 10/9, no bleeding No further transfusion at this time Continue twice daily PPI Continue octreotide drip Continue empiric Rocephin Monitor hemoglobin Continue holding Plavix 10/13 status post colonoscopy on 10/12 Found with 10 mm polyp in the ascending colon status post removal pathology has been sent Plan for EGD repeat to make sure patient is no longer bleeding and decide whether she can resume Plavix  PAD  -hx of femoral endartectomy on Plavix due to hx of AVM bleed.  10/13 continue to hold Plavix   AKI on CKD stage IIIb -Creatinine  progressively worsening -Baseline creatinine 1.57-2.0 10/13 creatinine still elevated nephrology consulted Avoid nephrotoxic medications    Hypertension BP better continue Norvasc and Coreg      HFrEF -last echo in 11/2020 with LV 25%.   Not volume overloaded and not in acute exacerbation DC IV fluids -Continue Coreg, atorvastatin   Cirrhosis with history of treated hepatitis C - No liver decompensation on  admission   COPD  -Continue PRN bronchodilator  DVT prophylaxis: SCD Code Status: Full Family Communication: None today.   Disposition Plan: Status is: Inpatient  Remains inpatient appropriate because:Inpatient level of care appropriate due to severity of illness  Dispo: The patient is from: Home              Anticipated d/c is to: Home              Patient currently is not medically stable to d/c.   Difficult to place patient No             Level of care: Progressive Cardiac  Consultants:  GI  Procedures:  EGD 10/9  Antimicrobials: None   Subjective: Denies dyspnea on exertion, shortness of breath, orthopnea, or chest pain Objective: Vitals:   05/25/21 2333 05/26/21 0524 05/26/21 0807 05/26/21 1144  BP: 114/78 (!) 157/85 130/79 (!) 152/96  Pulse: 80 76 78 72  Resp: 18 18 18 18   Temp: 98.3 F (36.8 C) 98.4 F (36.9 C) 98.2 F (36.8 C) 98.5 F (36.9 C)  TempSrc: Oral  Oral Oral  SpO2: 100% 100% 100% 100%  Weight:      Height:        Intake/Output Summary (Last 24 hours) at 05/26/2021 1406 Last data filed at 05/26/2021 1353 Gross per 24 hour  Intake 2596.36 ml  Output 600 ml  Net 1996.36 ml   Filed Weights   05/20/21 1526 05/21/21 0241 05/22/21 1208  Weight: 51.7 kg 54.7 kg 54.7 kg    Examination: NAD, calm CTA no wheeze Regular S1-S2 no gallops soft benign positive bowel sounds No edema grossly intact Mood and affect appropriate in current setting    Data Reviewed: I have personally reviewed following labs and imaging studies  CBC: Recent Labs  Lab 05/20/21 1529 05/21/21 0703 05/21/21 0854 05/24/21 0755 05/24/21 1144 05/25/21 0443 05/25/21 1511 05/25/21 1930 05/26/21 0545 05/26/21 1157  WBC 5.3 6.1  --  7.5  --  7.1  --   --   --   --   NEUTROABS  --   --   --  5.2  --  4.6  --   --   --   --   HGB 6.1* 7.7*   < > 9.4*   < > 9.6* 10.0* 8.7* 9.3* 9.9*  HCT 20.3* 24.0*  --  28.5*  --  30.3*  --   --   --   --   MCV 87.5  86.0  --  86.9  --  87.1  --   --   --   --   PLT 170 236  --  202  --  206  --   --   --   --    < > = values in this interval not displayed.   Basic Metabolic Panel: Recent Labs  Lab 05/20/21 1529 05/21/21 0703 05/24/21 0755 05/25/21 0443 05/26/21 0545  NA 139 140 137 138  --   K 3.6 3.5 3.4* 4.3  --   CL 108 109 104 109  --  CO2 26 24 25 23   --   GLUCOSE 98 83 110* 113*  --   BUN 16 16 11 10   --   CREATININE 1.88* 2.03* 2.11* 1.93* 2.15*  CALCIUM 8.2* 8.7* 8.4* 8.4*  --    GFR: Estimated Creatinine Clearance: 24.6 mL/min (A) (by C-G formula based on SCr of 2.15 mg/dL (H)). Liver Function Tests: Recent Labs  Lab 05/21/21 0703  AST 20  ALT 10  ALKPHOS 119  BILITOT 0.5  PROT 6.4*  ALBUMIN 2.6*   No results for input(s): LIPASE, AMYLASE in the last 168 hours. No results for input(s): AMMONIA in the last 168 hours. Coagulation Profile: Recent Labs  Lab 05/20/21 1825  INR 1.1   Cardiac Enzymes: No results for input(s): CKTOTAL, CKMB, CKMBINDEX, TROPONINI in the last 168 hours. BNP (last 3 results) No results for input(s): PROBNP in the last 8760 hours. HbA1C: No results for input(s): HGBA1C in the last 72 hours. CBG: No results for input(s): GLUCAP in the last 168 hours. Lipid Profile: No results for input(s): CHOL, HDL, LDLCALC, TRIG, CHOLHDL, LDLDIRECT in the last 72 hours. Thyroid Function Tests: No results for input(s): TSH, T4TOTAL, FREET4, T3FREE, THYROIDAB in the last 72 hours. Anemia Panel: No results for input(s): VITAMINB12, FOLATE, FERRITIN, TIBC, IRON, RETICCTPCT in the last 72 hours. Sepsis Labs: No results for input(s): PROCALCITON, LATICACIDVEN in the last 168 hours.  Recent Results (from the past 240 hour(s))  Resp Panel by RT-PCR (Flu A&B, Covid) Nasopharyngeal Swab     Status: None   Collection Time: 05/20/21  6:17 PM   Specimen: Nasopharyngeal Swab; Nasopharyngeal(NP) swabs in vial transport medium  Result Value Ref Range Status   SARS  Coronavirus 2 by RT PCR NEGATIVE NEGATIVE Final    Comment: (NOTE) SARS-CoV-2 target nucleic acids are NOT DETECTED.  The SARS-CoV-2 RNA is generally detectable in upper respiratory specimens during the acute phase of infection. The lowest concentration of SARS-CoV-2 viral copies this assay can detect is 138 copies/mL. A negative result does not preclude SARS-Cov-2 infection and should not be used as the sole basis for treatment or other patient management decisions. A negative result may occur with  improper specimen collection/handling, submission of specimen other than nasopharyngeal swab, presence of viral mutation(s) within the areas targeted by this assay, and inadequate number of viral copies(<138 copies/mL). A negative result must be combined with clinical observations, patient history, and epidemiological information. The expected result is Negative.  Fact Sheet for Patients:  07/20/21  Fact Sheet for Healthcare Providers:  07/20/21  This test is no t yet approved or cleared by the [employer] FDA and  has been authorized for detection and/or diagnosis of SARS-CoV-2 by FDA under an Emergency Use Authorization (EUA). This EUA will remain  in effect (meaning this test can be used) for the duration of the COVID-19 declaration under Section 564(b)(1) of the Act, 21 U.S.C.section 360bbb-3(b)(1), unless the authorization is terminated  or revoked sooner.       Influenza A by PCR NEGATIVE NEGATIVE Final   Influenza B by PCR NEGATIVE NEGATIVE Final    Comment: (NOTE) The Xpert Xpress SARS-CoV-2/FLU/RSV plus assay is intended as an aid in the diagnosis of influenza from Nasopharyngeal swab specimens and should not be used as a sole basis for treatment. Nasal washings and aspirates are unacceptable for Xpert Xpress SARS-CoV-2/FLU/RSV testing.  Fact Sheet for  Patients: SeriousBroker.it  Fact Sheet for Healthcare Providers: [place]  This test is not yet approved or cleared  by the [place] and has been authorized for detection and/or diagnosis of SARS-CoV-2 by FDA under an Emergency Use Authorization (EUA). This EUA will remain in effect (meaning this test can be used) for the duration of the COVID-19 declaration under Section 564(b)(1) of the Act, 21 U.S.C. section 360bbb-3(b)(1), unless the authorization is terminated or revoked.  Performed at [facility], [address] [place], [place] [address]          Radiology Studies: No results found.      Scheduled Meds:  amLODipine  10 mg Oral Daily   atorvastatin  80 mg Oral QHS   carvedilol  25 mg Oral BID WC   dicyclomine  20 mg Oral TID AC & HS   DULoxetine  30 mg Oral Daily   hydrALAZINE  50 mg Oral Q8H   pantoprazole (PROTONIX) IV  40 mg Intravenous Q12H   pregabalin  100 mg Oral TID   Continuous Infusions:  sodium chloride     sodium chloride     cefTRIAXone (ROCEPHIN)  IV 2 g (05/26/21 1134)   octreotide  (SANDOSTATIN)    IV infusion 50 mcg/hr (05/26/21 1100)     LOS: 5 days    Time spent: 35 minutes with more than 50% on COC    Lynn Ito, MD [facility]   If 7PM-7AM, please contact night-coverage  05/26/2021, 2:06 PM

## 2021-05-26 NOTE — Progress Notes (Signed)
Capsule report has been sent for scanning  Findings:  Landmarks: Z-Line 00:01:14 First Gastric Image 00:01:30 First Duodenal Image 02:47:51 First Cecal Image 07:42:36  Findings: Mucus with red material, or old clot seen in the stomach  Small bowel showed string shaped material that may be food vs parasite    Recommendations:  I would recommend EGD tomorrow to evaluate the red material seen in the stomach to ensure it is not a clot before pt restarts her Plavix  I will obtain stool for Ova and parasites given string like material seen in the small bowel  Pt does have previous history of iron deficiency and treatment for parasites can be considered as an outpatient

## 2021-05-26 NOTE — TOC Progression Note (Signed)
Transition of Care [facility]) - Progression Note    Patient Details  Name: Caroline Smith MRN: [id number] Date of Birth: 1961-07-14  Transition of [facility]) CM/SW Contact  Chapman Fitch, RN Phone Number: 05/26/2021, 2:53 PM  Clinical Narrative:    Plan for repeat EGD tomorrow. No anticipated TOC needs.  Please consult if indicated    Expected Discharge Plan: Home/Self Care Barriers to Discharge: Continued Medical Work up  Expected Discharge Plan and Services Expected Discharge Plan: Home/Self Care                                               Social Determinants of Health (SDOH) Interventions    Readmission Risk Interventions Readmission Risk Prevention Plan 05/24/2021  Transportation Screening Complete  Palliative Care Screening Not Applicable  Medication Review (RN Care Manager) Complete  Some recent data might be hidden

## 2021-05-27 ENCOUNTER — Encounter: Payer: Self-pay | Admitting: Family Medicine

## 2021-05-27 ENCOUNTER — Inpatient Hospital Stay: Payer: Medicaid Other

## 2021-05-27 ENCOUNTER — Inpatient Hospital Stay: Payer: Medicaid Other | Admitting: Certified Registered"

## 2021-05-27 ENCOUNTER — Encounter
Admission: EM | Disposition: A | Discharge status: Home / Self Care | Payer: Self-pay | Source: Home / Self Care | Attending: Internal Medicine

## 2021-05-27 DIAGNOSIS — K921 Melena: Secondary | ICD-10-CM | POA: Diagnosis not present

## 2021-05-27 DIAGNOSIS — K319 Disease of stomach and duodenum, unspecified: Secondary | ICD-10-CM | POA: Diagnosis not present

## 2021-05-27 DIAGNOSIS — D62 Acute posthemorrhagic anemia: Secondary | ICD-10-CM | POA: Diagnosis not present

## 2021-05-27 DIAGNOSIS — K31819 Angiodysplasia of stomach and duodenum without bleeding: Secondary | ICD-10-CM

## 2021-05-27 HISTORY — PX: ESOPHAGOGASTRODUODENOSCOPY: SHX5428

## 2021-05-27 LAB — HEMOGLOBIN
Hemoglobin: 8.4 g/dL — ABNORMAL LOW (ref 12.0–15.0)
Hemoglobin: 9.5 g/dL — ABNORMAL LOW (ref 12.0–15.0)
Hemoglobin: 9 g/dL — ABNORMAL LOW (ref 12.0–15.0)

## 2021-05-27 SURGERY — EGD (ESOPHAGOGASTRODUODENOSCOPY)
Anesthesia: General

## 2021-05-27 MED ORDER — PROPOFOL 500 MG/50ML IV EMUL
INTRAVENOUS | Status: DC | PRN
  Administered 2021-05-27: 100 ug/kg/min via INTRAVENOUS

## 2021-05-27 MED ORDER — GLYCOPYRROLATE 0.2 MG/ML IJ SOLN
INTRAMUSCULAR | Status: DC | PRN
  Administered 2021-05-27: .2 mg via INTRAVENOUS

## 2021-05-27 MED ORDER — SODIUM CHLORIDE 0.9 % IV SOLN: INTRAVENOUS | Status: DC

## 2021-05-27 MED ORDER — SODIUM PERTECHNETATE TC 99M INJECTION
10.0000 | Freq: Once | INTRAVENOUS | Status: AC | PRN
  Administered 2021-05-27: 10.44 via INTRAVENOUS

## 2021-05-27 MED ORDER — PROPOFOL 10 MG/ML IV BOLUS
INTRAVENOUS | Status: DC | PRN
  Administered 2021-05-27: 40 mg via INTRAVENOUS

## 2021-05-27 MED ORDER — LIDOCAINE HCL (CARDIAC) PF 100 MG/5ML IV SOSY
PREFILLED_SYRINGE | INTRAVENOUS | Status: DC | PRN
  Administered 2021-05-27: 80 mg via INTRAVENOUS

## 2021-05-27 NOTE — Progress Notes (Signed)
Patient back from EGD procedure. Was successful. Doctor contacted regarding diet order. Vital signs stable at baseline. Patient alert and oriented.

## 2021-05-27 NOTE — Progress Notes (Signed)
PROGRESS NOTE    Caroline Smith  HKV:[id number] DOB: 10/22/1960 DOA: 05/20/2021 PCP: Everlean Patterson, NP    Brief Narrative:  60 y.o. female with medical history significant for PVD, PAD, hypertension, cardiomyopathy with EF of 25%, asthma/COPD, cirrhosis with hepatitis C, history of GI bleed, CKD stage III, polysubstance abuse who presents with concerns of bleeding.  Patient reports having free-flowing vaginal bleeding for the past 2 days.  Has been using 3-4 pads daily. Last menstrual was when she was 60yo. Upon further questioning she is not sure if source of bleeding is vaginal or rectal   States last bowel movement was about 2 days ago and did not have blood when she wiped but noticed stool was more dark.  Notes diffuse abdominal pain.  Has nausea but no vomiting.  Denies any diarrhea. She does have a history of suspected GI bleed back in 2020 when she was on aspirin, Plavix and pletal. EGD at that time did not reveal source of bleeding.  Capsule study with nonbleeding AVMs.  Liver ultrasound negative for portal vein thrombus.  At that time her antiplatelets were stopped due to high risk of bleeding.    Patient received 1 unit packed red blood cells for hemoglobin 6.1.  Hemoglobin responded to 7.7.  Patient unclear whether the bleeding may be vaginal or rectal.  Vaginal ultrasound not indicative vaginal bleed.  GI consulted on 10/8.  Status post EGD on 10/9.  EGD normal.  Plan for colonoscopy on 10/10 however patient was unable to tolerate bowel prep.  GI aware.  Now plan for clear liquid diet today.  Reattempted bowel prep.    Reattempt C-scope on 10/11.  10/12 plan for C-scope today as she patient reports taking all her prep today 10/13 pt has no complaints this am. Spoke to GI, will do another EGD to make sure no bleeding to make sure ok to resume plavix then. 10/14 feels little abd discomfort. No blood in stool. EGD today  Assessment & Plan:   Principal Problem:    Acute blood loss anemia Active Problems:   Liver cirrhosis (HCC)   Uncontrolled hypertension   PAD (peripheral artery disease) (HCC)   Acute-on-chronic kidney injury (HCC)   Chronic HFrEF (heart failure with reduced ejection fraction) (HCC)   COPD (chronic obstructive pulmonary disease) (HCC)   ABLA (acute blood loss anemia)   Hematochezia   Polyp of ascending colon   Acute on chronic blood loss anemia  Hgb of 6.1 from baseline around 11.   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   Last required transfusion back in April 2022 due to suspected bleeding AVMs and was transition to Plavix monotherapy. Transfuse 1 unit PRBC on admission for hemoglobin 6.1 Hemoglobin responded appropriately Still unclear whether the bleeding is vaginal or rectal Patient does not have periods Transvaginal ultrasound with simple fluid not convincing for blood, no endometrial thickening CT abdomen pelvis not possible due to renal insufficiency EGD 10/9, no bleeding No further transfusion at this time Continue twice daily PPI Continue octreotide drip Continue empiric Rocephin Monitor hemoglobin Continue holding Plavix 10/13 status post colonoscopy on 10/12 Found with 10 mm polyp in the ascending colon status post removal pathology has been sent 10/14 plan for EGD today to see if bleed present and then decide on plavix to be resumed .  PAD  -hx of femoral endartectomy on Plavix due to hx of AVM bleed.  10/14 hold  Plavix   AKI on CKD stage IIIb -Creatinine progressively worsening -Baseline creatinine 1.57-2.0 10/14 AKI likely prerenal azotemia due to acute blood loss  Chronic kidney disease secondary to hypertension  No acute need for dialysis.  No IV contrast exposure.   Nephrology consulted, suggest patient follow-up with nephrology after discharge  Avoid nephrotoxic meds       Hypertension Continue Norvasc and Coreg       HFrEF -last echo in 11/2020 with LV 25%.   Not volume overloaded and not in acute exacerbation DC IV fluids -Continue Coreg, atorvastatin   Cirrhosis with history of treated hepatitis C - No liver decompensation on admission   COPD  -Continue PRN bronchodilator  DVT prophylaxis: SCD Code Status: Full Family Communication: None today.   Disposition Plan: Status is: Inpatient  Remains inpatient appropriate because:Inpatient level of care appropriate due to severity of illness  Dispo: The patient is from: Home              Anticipated d/c is to: Home              Patient currently is not medically stable to d/c.   Difficult to place patient No             Level of care: Progressive Cardiac  Consultants:  GI  Procedures:  EGD 10/9  Antimicrobials: None   Subjective: No shortness of breath, chest pain, dizziness    Objective: Vitals:   05/26/21 1938 05/26/21 2347 05/27/21 0420 05/27/21 0744  BP: (!) 153/83 (!) 163/82 (!) 155/83 (!) 144/92  Pulse: 78 79 86 79  Resp: 18 14 16 19   Temp: 98.5 F (36.9 C) 98.4 F (36.9 C) 98.6 F (37 C) 98.7 F (37.1 C)  TempSrc: Oral Oral Oral   SpO2: 100% 100% 100% 98%  Weight:      Height:        Intake/Output Summary (Last 24 hours) at 05/27/2021 0809 Last data filed at 05/27/2021 0600 Gross per 24 hour  Intake 3664.02 ml  Output 1225 ml  Net 2439.02 ml   Filed Weights   05/20/21 1526 05/21/21 0241 05/22/21 1208  Weight: 51.7 kg 54.7 kg 54.7 kg    Examination: NAD, calm CTA no wheeze rales rhonchi's Regular S1-S2 no gallops Soft benign positive bowel sounds No edema Aaxox3   Data Reviewed: I have personally reviewed following labs and imaging studies  CBC: Recent Labs  Lab 05/20/21 1529 05/21/21 0703 05/21/21 0854 05/24/21 0755 05/24/21 1144 05/25/21 0443 05/25/21 1511 05/25/21 1930 05/26/21 0545 05/26/21 1157 05/26/21 2007 05/27/21 0400  WBC 5.3 6.1  --  7.5  --  7.1  --   --   --    --   --   --   NEUTROABS  --   --   --  5.2  --  4.6  --   --   --   --   --   --   HGB 6.1* 7.7*   < > 9.4*   < > 9.6*   < > 8.7* 9.3* 9.9* 9.2* 8.4*  HCT 20.3* 24.0*  --  28.5*  --  30.3*  --   --   --   --   --   --   MCV 87.5 86.0  --  86.9  --  87.1  --   --   --   --   --   --   PLT 170 236  --  202  --  206  --   --   --   --   --   --    < > = values in this interval not displayed.   Basic Metabolic Panel: Recent Labs  Lab 05/20/21 1529 05/21/21 0703 05/24/21 0755 05/25/21 0443 05/26/21 0545  NA 139 140 137 138  --   K 3.6 3.5 3.4* 4.3  --   CL 108 109 104 109  --   CO2 26 24 25 23   --   GLUCOSE 98 83 110* 113*  --   BUN 16 16 11 10   --   CREATININE 1.88* 2.03* 2.11* 1.93* 2.15*  CALCIUM 8.2* 8.7* 8.4* 8.4*  --    GFR: Estimated Creatinine Clearance: 24.6 mL/min (A) (by C-G formula based on SCr of 2.15 mg/dL (H)). Liver Function Tests: Recent Labs  Lab 05/21/21 0703  AST 20  ALT 10  ALKPHOS 119  BILITOT 0.5  PROT 6.4*  ALBUMIN 2.6*   No results for input(s): LIPASE, AMYLASE in the last 168 hours. No results for input(s): AMMONIA in the last 168 hours. Coagulation Profile: Recent Labs  Lab 05/20/21 1825  INR 1.1   Cardiac Enzymes: No results for input(s): CKTOTAL, CKMB, CKMBINDEX, TROPONINI in the last 168 hours. BNP (last 3 results) No results for input(s): PROBNP in the last 8760 hours. HbA1C: No results for input(s): HGBA1C in the last 72 hours. CBG: No results for input(s): GLUCAP in the last 168 hours. Lipid Profile: No results for input(s): CHOL, HDL, LDLCALC, TRIG, CHOLHDL, LDLDIRECT in the last 72 hours. Thyroid Function Tests: No results for input(s): TSH, T4TOTAL, FREET4, T3FREE, THYROIDAB in the last 72 hours. Anemia Panel: No results for input(s): VITAMINB12, FOLATE, FERRITIN, TIBC, IRON, RETICCTPCT in the last 72 hours. Sepsis Labs: No results for input(s): PROCALCITON, LATICACIDVEN in the last 168 hours.  Recent Results (from the  past 240 hour(s))  Resp Panel by RT-PCR (Flu A&B, Covid) Nasopharyngeal Swab     Status: None   Collection Time: 05/20/21  6:17 PM   Specimen: Nasopharyngeal Swab; Nasopharyngeal(NP) swabs in vial transport medium  Result Value Ref Range Status   SARS Coronavirus 2 by RT PCR NEGATIVE NEGATIVE Final    Comment: (NOTE) SARS-CoV-2 target nucleic acids are NOT DETECTED.  The SARS-CoV-2 RNA is generally detectable in upper respiratory specimens during the acute phase of infection. The lowest concentration of SARS-CoV-2 viral copies this assay can detect is 138 copies/mL. A negative result does not preclude SARS-Cov-2 infection and should not be used as the sole basis for treatment or other patient management decisions. A negative result may occur with  improper specimen collection/handling, submission of specimen other than nasopharyngeal swab, presence of viral mutation(s) within the areas targeted by this assay, and inadequate number of viral copies(<138 copies/mL). A negative result must be combined with clinical observations, patient history, and epidemiological information. The expected result is Negative.  Fact Sheet for Patients:  07/20/21  Fact Sheet for Healthcare Providers:  07/20/21  This test is no t yet approved or cleared by the [employer] FDA and  has been authorized for detection and/or diagnosis of SARS-CoV-2 by FDA under an Emergency Use Authorization (EUA). This EUA will remain  in effect (meaning this test can be used) for the duration of the COVID-19 declaration under Section 564(b)(1) of the Act, 21 U.S.C.section 360bbb-3(b)(1), unless the authorization is terminated  or revoked sooner.       Influenza A by PCR NEGATIVE NEGATIVE Final  Influenza B by PCR NEGATIVE NEGATIVE Final    Comment: (NOTE) The Xpert Xpress SARS-CoV-2/FLU/RSV plus assay is intended as an aid in the diagnosis of  influenza from Nasopharyngeal swab specimens and should not be used as a sole basis for treatment. Nasal washings and aspirates are unacceptable for Xpert Xpress SARS-CoV-2/FLU/RSV testing.  Fact Sheet for Patients: [employer]  Fact Sheet for Healthcare Providers: [employer].it  This test is not yet approved or cleared by the [place] FDA and has been authorized for detection and/or diagnosis of SARS-CoV-2 by FDA under an Emergency Use Authorization (EUA). This EUA will remain in effect (meaning this test can be used) for the duration of the COVID-19 declaration under Section 564(b)(1) of the Act, 21 U.S.C. section 360bbb-3(b)(1), unless the authorization is terminated or revoked.  Performed at [facility], [address] [place], [place] [address]          Radiology Studies: No results found.      Scheduled Meds:  amLODipine  10 mg Oral Daily   atorvastatin  80 mg Oral QHS   carvedilol  25 mg Oral BID WC   dicyclomine  20 mg Oral TID AC & HS   DULoxetine  30 mg Oral Daily   hydrALAZINE  50 mg Oral Q8H   pantoprazole (PROTONIX) IV  40 mg Intravenous Q12H   pregabalin  100 mg Oral TID   Continuous Infusions:  sodium chloride     sodium chloride     cefTRIAXone (ROCEPHIN)  IV 2 g (05/26/21 1134)   octreotide  (SANDOSTATIN)    IV infusion 50 mcg/hr (05/27/21 0214)     LOS: 6 days    Time spent: 35 minutes with more than 50% on COC    Lynn Ito, MD [facility]   If 7PM-7AM, please contact night-coverage  05/27/2021, 8:09 AM

## 2021-05-27 NOTE — Progress Notes (Signed)
Initial Nutrition Assessment  DOCUMENTATION CODES:   Underweight  INTERVENTION:   RD will add supplements once pt's diet is advanced  Pt at high refeed risk; recommend monitor potassium, magnesium and phosphorus labs daily until stable  NUTRITION DIAGNOSIS:   Inadequate oral intake related to acute illness as evidenced by NPO status.  GOAL:   Patient will meet greater than or equal to 90% of their needs  MONITOR:   Diet advancement, Labs, Weight trends, Skin, I & O's  REASON FOR ASSESSMENT:   NPO/Clear Liquid Diet    ASSESSMENT:   60 y.o. female with medical history significant for PVD, PAD, hypertension, cardiomyopathy with EF of 25%, asthma/COPD, cirrhosis with hepatitis C, history of GI bleed, CKD stage III, gastropareis and polysubstance abuse who presents with concerns of bleeding.  Pt s/p EGD today; noted to have hiatal hernia, erythematous and duodenal diverticulum   Unable to see pt today as pt in procedure at time of RD visit. Per chart review, pt on NPO/clear liquid diet from 10/7-10/12. Pt advanced to a heart healthy diet 10/12 but was made NPO again today for EGD. Pt documented to be eating < 50% of meals in hospital. RD will add supplements to help pt meet her estimated needs once her diet is advanced. Pt is likely at refeed risk. Per chart, pt appears weight stable pta. RD will follow-up to obtain nutrition related exam and history at follow-up. Pt is at high risk for malnutrition.   Medications reviewed and include: bentyl, protonix, ceftriaxone, octreotide   Labs reviewed: K 4.3 wnl, creat 2.15(H) Hgb 8.4(L)  NUTRITION - FOCUSED PHYSICAL EXAM: Unable to perform at this time   Diet Order:   Diet Order             Diet NPO time specified Except for: Sips with Meds  Diet effective midnight                  EDUCATION NEEDS:   No education needs have been identified at this time  Skin:  Skin Assessment: Reviewed RN Assessment  Last BM:  10/12-  type 7  Height:   Ht Readings from Last 1 Encounters:  05/22/21 5\' 7"  (1.702 m)    Weight:   Wt Readings from Last 1 Encounters:  05/22/21 54.7 kg    Ideal Body Weight:  61.36 kg  BMI:  Body mass index is 18.89 kg/m.  Estimated Nutritional Needs:   Kcal:  1600-1800kcal/day  Protein:  80-90g/day  Fluid:  1.6-1.8L/day  07/22/21 MS, RD, LDN Please refer to [facility] for RD and/or RD on-call/weekend/after hours pager

## 2021-05-27 NOTE — Plan of Care (Signed)
  Problem: Education: Goal: Knowledge of General Education information will improve Description: Including pain rating scale, medication(s)/side effects and non-pharmacologic comfort measures Outcome: Progressing   Problem: Clinical Measurements: Goal: Will remain free from infection Outcome: Progressing   Problem: Pain Managment: Goal: General experience of comfort will improve Outcome: Progressing   

## 2021-05-27 NOTE — Progress Notes (Signed)
Medications administered this morning. Patient has been NPO since midnight. Endoscopy to be performed today. Patient was taken for procedure at noon.

## 2021-05-27 NOTE — Op Note (Signed)
[facility] Gastroenterology Patient Name: Caroline Smith Procedure Date: 05/27/2021 11:57 AM MRN: [id number] Account #: [id number] Date of Birth: 04/14/1961 Admit Type: Inpatient Age: 60 Room: [facility] ENDO ROOM 2 Gender: Female Note Status: Finalized Instrument Name: Laurette Schimke [id number] Procedure:             Upper GI endoscopy Indications:           Hematochezia Providers:             Liddy Deam B. Maximino Greenland MD, MD Referring MD:          Sallye Lat Md, MD (Referring MD) Medicines:             Monitored Anesthesia Care Complications:         No immediate complications. Procedure:             Pre-Anesthesia Assessment:                        - The risks and benefits of the procedure and the                         sedation options and risks were discussed with the                         patient. All questions were answered and informed                         consent was obtained.                        - Patient identification and proposed procedure were                         verified prior to the procedure.                        - ASA Grade Assessment: II - A patient with mild                         systemic disease.                        After obtaining informed consent, the endoscope was                         passed under direct vision. Throughout the procedure,                         the patient's blood pressure, pulse, and oxygen                         saturations were monitored continuously. The Endoscope                         was introduced through the mouth, and advanced to the                         second part of duodenum. The upper GI endoscopy was  accomplished with ease. The patient tolerated the                         procedure well. Findings:      The examined esophagus was normal.      A small hiatal hernia was present.      Patchy mildly erythematous mucosa without bleeding was found in the       gastric  body.      The exam of the stomach was otherwise normal.      A single angioectasia with bleeding on contact was found in the second       portion of the duodenum. Coagulation for hemostasis using argon plasma       was successful.      A non-bleeding diverticulum was found in the second portion of the       duodenum. This was a peri-ampullary diverticulum and the ampulla was       seen adjacent to it.      The exam of the duodenum was otherwise normal. Impression:            - Normal esophagus.                        - Small hiatal hernia.                        - Erythematous mucosa in the gastric body.                        - A single angioectasia in the duodenum. Treated with                         argon plasma coagulation (APC).                        - Non-bleeding duodenal diverticulum.                        - No specimens collected. Recommendation:        - Return patient to hospital ward for ongoing care.                        - Meckel's scan given the amount of blood seen on                         patient's initial flexibe sigmoidoscopy on this                         admission. As that amount of bleeding may not be                         explained by the small AVM seen today. Discussed with                         Dr. Marylu Lund                        - Continue Serial CBCs and transfuse PRN                        -  Continue present medications.                        - The findings and recommendations were discussed with                         the patient.                        - Resume previous diet. Procedure Code(s):     --- Professional ---                        [phone], Esophagogastroduodenoscopy, flexible,                         transoral; with control of bleeding, any method Diagnosis Code(s):     --- Professional ---                        K44.9, Diaphragmatic hernia without obstruction or                         gangrene                        K31.89, Other  diseases of stomach and duodenum                        K31.819, Angiodysplasia of stomach and duodenum                         without bleeding                        K92.1, Melena (includes Hematochezia)                        K57.10, Diverticulosis of small intestine without                         perforation or abscess without bleeding CPT copyright 2019 [employer]. All rights reserved. The codes documented in this report are preliminary and upon coder review may  be revised to meet current compliance requirements.  Melodie Bouillon, MD Michel Bickers B. Maximino Greenland MD, MD 05/27/2021 1:27:29 PM This report has been signed electronically. Number of Addenda: 0 Note Initiated On: 05/27/2021 11:57 AM Estimated Blood Loss:  Estimated blood loss: none.      [facility]

## 2021-05-27 NOTE — Transfer of Care (Signed)
Immediate Anesthesia Transfer of Care Note  Patient: Caroline Smith  Procedure(s) Performed: ESOPHAGOGASTRODUODENOSCOPY (EGD)  Patient Location: Endoscopy Unit  Anesthesia Type:General  Level of Consciousness: drowsy and patient cooperative  Airway & Oxygen Therapy: Patient Spontanous Breathing and Patient connected to face mask oxygen  Post-op Assessment: Report given to RN and Post -op Vital signs reviewed and stable  Post vital signs: Reviewed and stable  Last Vitals:  Vitals Value Taken Time  BP 165/95 05/27/21 1300  Temp 36.1 C 05/27/21 1300  Pulse 94 05/27/21 1300  Resp 10 05/27/21 1300  SpO2 100 % 05/27/21 1300    Last Pain:  Vitals:   05/27/21 1300  TempSrc: Temporal  PainSc: Asleep      Patients Stated Pain Goal: 2 (05/27/21 0852)  Complications: No notable events documented.

## 2021-05-27 NOTE — Anesthesia Postprocedure Evaluation (Signed)
Anesthesia Post Note  Patient: Caroline Smith  Procedure(s) Performed: ESOPHAGOGASTRODUODENOSCOPY (EGD)  Patient location during evaluation: PACU Anesthesia Type: General Level of consciousness: awake and alert, oriented and patient cooperative Pain management: pain level controlled Vital Signs Assessment: post-procedure vital signs reviewed and stable Respiratory status: spontaneous breathing, nonlabored ventilation and respiratory function stable Cardiovascular status: blood pressure returned to baseline and stable Postop Assessment: adequate PO intake Anesthetic complications: no   No notable events documented.   Last Vitals:  Vitals:   05/27/21 1320 05/27/21 1330  BP: (!) 181/100 (!) 165/96  Pulse: 91 88  Resp: 13 18  Temp:    SpO2: 99% 98%    Last Pain:  Vitals:   05/27/21 1330  TempSrc:   PainSc: 0-No pain                 Reed Breech

## 2021-05-27 NOTE — Anesthesia Procedure Notes (Signed)
Procedure Name: General with mask airway Date/Time: 05/27/2021 12:53 PM Performed by: Mohammed Kindle, CRNA Pre-anesthesia Checklist: Patient identified, Emergency Drugs available, Suction available and Patient being monitored Patient Re-evaluated:Patient Re-evaluated prior to induction Oxygen Delivery Method: Simple face mask Induction Type: IV induction Placement Confirmation: positive ETCO2 and CO2 detector Dental Injury: Teeth and Oropharynx as per pre-operative assessment

## 2021-05-27 NOTE — Anesthesia Preprocedure Evaluation (Signed)
Anesthesia Evaluation  Patient identified by MRN, date of birth, ID band Patient awake    Reviewed: Allergy & Precautions, NPO status , Patient's Chart, lab work & pertinent test results  History of Anesthesia Complications Negative for: history of anesthetic complications  Airway Mallampati: IV   Neck ROM: Full    Dental  (+) Poor Dentition   Pulmonary COPD, Current Smoker and Patient abstained from smoking.,    Pulmonary exam normal breath sounds clear to auscultation       Cardiovascular hypertension, + Peripheral Vascular Disease (hx femoral endarterectomy on Plavix) and +CHF (NICM, EF 25%)  Normal cardiovascular exam Rhythm:Regular Rate:Normal     Neuro/Psych PSYCHIATRIC DISORDERS Anxiety Depression Polysubstance use disorder: cocaine (last used 2 months ago), alcohol (1 month ago), marijuana   GI/Hepatic GERD  ,(+) Cirrhosis       , Hepatitis -, C  Endo/Other    Renal/GU Renal disease (stage III CKD)     Musculoskeletal   Abdominal   Peds  Hematology  (+) Blood dyscrasia (acute blood loss), anemia ,   Anesthesia Other Findings Cardiology note 01/12/21:  Assessment and Plan   60 y.o. female with  1. Heart failure with reduced left ventricular function (CMS-HCC)  -No evidence of CAD per [facility] on 12/16/20. Will continue GDMT of carvedilol 25mg  BID, Lasix 40mg  once daily, hydralazine 50mg  BID, and Imdur 30mg  once daily. Jardiance recently added at 10mg . 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 Will defer use of ACE/ARB/Entresto/Spironolactone due to renal function. Patient is euvolemic on exam today. Will continue current medications as discussed above  2. Atherosclerotic PVD with intermittent claudication (CMS-HCC)  -Continue Plavix 75mg  once daily and atorvastatin 80mg  once daily  3. Unstable angina  (CMS-HCC)  -Resolved with no evidence of CAD per [facility]  4. COPD with asthma , unspecified (CMS-HCC)  -Continue current medications and routine follow up with PCP  5. Hypercholesteremia  -Continue atorvastatin 80mg  daily  6. Hx of polysubstance abuse: cocaine, ETOH, marijuana    Reproductive/Obstetrics                             Anesthesia Physical  Anesthesia Plan  ASA: 4  Anesthesia Plan: General   Post-op Pain Management:    Induction: Intravenous  PONV Risk Score and Plan: 2 and Propofol infusion, TIVA and Treatment may vary due to age or medical condition  Airway Management Planned: Natural Airway  Additional Equipment:   Intra-op Plan:   Post-operative Plan:   Informed Consent: I have reviewed the patients History and Physical, chart, labs and discussed the procedure including the risks, benefits and alternatives for the proposed anesthesia with the patient or authorized representative who has indicated his/her understanding and acceptance.       Plan Discussed with: CRNA  Anesthesia Plan Comments:         Anesthesia Quick Evaluation

## 2021-05-28 LAB — CREATININE, SERUM
Creatinine, Ser: 2.02 mg/dL — ABNORMAL HIGH (ref 0.44–1.00)
GFR, Estimated: 28 mL/min — ABNORMAL LOW (ref >60)

## 2021-05-28 LAB — HEMOGLOBIN AND HEMATOCRIT, BLOOD
HCT: 28.2 % — ABNORMAL LOW (ref 36.0–46.0)
Hemoglobin: 9 g/dL — ABNORMAL LOW (ref 12.0–15.0)

## 2021-05-28 LAB — HEMOGLOBIN: Hemoglobin: 9.9 g/dL — ABNORMAL LOW (ref 12.0–15.0)

## 2021-05-28 MED ORDER — HYDRALAZINE HCL 25 MG PO TABS: 25.0000 mg | ORAL_TABLET | Freq: Three times a day (TID) | ORAL | 0 refills | Status: AC

## 2021-05-28 MED ORDER — PANTOPRAZOLE SODIUM 40 MG PO TBEC: 40.0000 mg | DELAYED_RELEASE_TABLET | Freq: Two times a day (BID) | ORAL | 1 refills | Status: AC

## 2021-05-28 MED ORDER — DICYCLOMINE HCL 20 MG PO TABS: 20.0000 mg | ORAL_TABLET | Freq: Three times a day (TID) | ORAL | 0 refills | Status: AC

## 2021-05-28 NOTE — Discharge Summary (Signed)
Caroline Smith NWG:[id number] DOB: March 24, 1961 DOA: 05/20/2021  PCP: Caroline Patterson, NP  Admit date: 05/20/2021 Discharge date: 05/28/2021  Admitted From: Home Disposition: Home  Recommendations for Outpatient Follow-up:  Follow up with PCP in 1 week Please obtain BMP/CBC in one week Please follow up with GI Dr. Maximino Greenland in 2 weeks Follow with primary nephrology in 2-week     Discharge Condition:Stable CODE STATUS:full  Diet recommendation: Heart Healthy Brief/Interim Summary: Per HPI: Caroline Smith is a 60 y.o. female with medical history significant for PVD, PAD, hypertension, cardiomyopathy with EF of 25%, asthma/COPD, cirrhosis with hepatitis C, history of GI bleed, CKD stage III, polysubstance abuse who presents with concerns of bleeding.Patient reports having free-flowing vaginal bleeding for the past 2 days.  Has been using 3-4 pads daily. Last menstrual was when she was 60yo. Upon further questioning she is not sure if source of bleeding is vaginal or rectal   States last bowel movement was about 2 days ago and did not have blood when she wiped but noticed stool was more dark.  Notes diffuse abdominal pain.  Has nausea but no vomiting.  Denies any diarrhea. She does have a history of suspected GI bleed back in 2020 when she was on aspirin, Plavix and pletal. EGD at that time did not reveal source of bleeding.  Capsule study with nonbleeding AVMs.  Liver ultrasound negative for portal vein thrombus.  At that time her antiplatelets were stopped due to high risk of bleeding.  Patient reports tobacco and marijuana use.  Denies alcohol use. ED course she was found with hemoglobin of 6.1 from prior of around 11.  No leukocytosis.  Creatinine 1.88.  Received 1 unit packed red blood cell.  She was admitted to the hospital.  Vaginal ultrasound not indicated vaginal bleed.  GI was consulted.  Acute on chronic blood loss anemia  Hgb of 6.1 from baseline around 11.   Last  required transfusion back in April 2022 due to suspected bleeding AVMs and was transition to Plavix monotherapy. Transfuse 1 unit PRBC on admission for hemoglobin 6.1 Hemoglobin responded appropriately and remained stable. 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  A single angioectasias in the duodenum treated with APC.  Erythematous mucosa in the gastric body.  Small hiatal hernia. GI recommended Meckel scan and if that was negative to resume Plavix Meckel scan done on 10/14 found No scintigraphic evidence for ectopic gastric mucosa containing Meckel's diverticulum. Spoke to Dr. Norma Fredrickson via chat this am, cleared pt to resume plavix and asa. Will need to f/u with GI and pcp    PAD  -hx of femoral endartectomy on Plavix due to hx of AVM bleed.  Resume plavix   AKI on CKD stage IIIb -Creatinine progressively worsening -Baseline creatinine 1.57-2.0 AKI likely prerenal azotemia due to acute blood loss  Chronic kidney disease secondary to hypertension  No acute need for dialysis.  No IV contrast exposure.   Nephrology consulted, suggest patient follow-up with nephrology after discharge             Hypertension Continue meds         HFrEF -last echo in 11/2020 with LV  25%.   Not volume overloaded and not in acute exacerbation -Continue Coreg, atorvastatin   Cirrhosis with history of treated hepatitis C - No liver decompensation on admission   COPD  -Continue PRN bronchodilator Not in acute exacerbation     Discharge Diagnoses:  Principal Problem:   Acute blood loss anemia Active Problems:   Liver  cirrhosis (HCC)   Uncontrolled hypertension   PAD (peripheral artery disease) (HCC)   Acute-on-chronic kidney injury (HCC)   Chronic HFrEF (heart failure with reduced ejection fraction) (HCC)   COPD (chronic obstructive pulmonary disease) (HCC)   ABLA (acute blood loss anemia)   Hematochezia   Polyp of ascending colon    Discharge Instructions  Discharge Instructions     Call MD for:  persistant dizziness or light-headedness   Complete by: As directed    Diet - low sodium heart healthy   Complete by: As directed    Increase activity slowly   Complete by: As directed       Allergies as of 05/28/2021       Reactions   Tramadol Other (See Comments), Itching   Reaction:  Gave pt anxiety  "nervous feeling" Makes her nervous. Denied itching, rash, swelling, or difficulty breathing when she took this medicine. 07/31/19 states took 3 weeks ago did not cause itching or making her nervous it just did not help Reaction:  Gave pt anxiety  Makes her nervous "nervous feeling" Makes her nervous. Denied itching, rash, swelling, or difficulty breathing when she took this medicine. 07/31/19 states took 3 weeks ago did not cause itching or making her nervous it just did not help        Medication List     STOP taking these medications    gabapentin 100 MG capsule Commonly known as: NEURONTIN   omeprazole 20 MG capsule Commonly known as: PRILOSEC   ondansetron 4 MG tablet Commonly known as: ZOFRAN       TAKE these medications    acetaminophen 500 MG tablet Commonly known as: TYLENOL Take 1,000 mg by mouth every 6 (six) hours as needed for mild pain.   Allergy Relief 10 MG tablet Generic drug: loratadine Take 10 mg by mouth daily.   aluminum-magnesium hydroxide-simethicone 200-200-20 MG/5ML Susp Commonly known as: MAALOX Take 30 mLs by mouth 4 (four) times daily -  before meals and at bedtime.   amLODipine 10 MG tablet Commonly known as: NORVASC Take 10 mg by  mouth daily.   Aspirin Low Dose 81 MG EC tablet Generic drug: aspirin Take 81 mg by mouth daily.   atorvastatin 80 MG tablet Commonly known as: LIPITOR Take 80 mg by mouth at bedtime.   carvedilol 25 MG tablet Commonly known as: COREG Take 1 tablet (25 mg total) by mouth 2 (two) times daily with a meal.   clopidogrel 75 MG tablet Commonly known as: PLAVIX Take 75 mg by mouth daily.   dicyclomine 20 MG tablet Commonly known as: BENTYL Take 1 tablet (20 mg total) by mouth 4 (four) times daily -  before meals and at bedtime.   DULoxetine 30 MG capsule Commonly known as: CYMBALTA Take 30 mg by mouth daily.   furosemide 40 MG tablet Commonly known as: LASIX Take 40 mg by mouth daily as needed.   hydrALAZINE 25 MG tablet Commonly known as: APRESOLINE Take 1 tablet (25 mg total) by mouth every 8 (eight) hours.   Jardiance 10 MG Tabs tablet Generic drug: empagliflozin Take 10 mg by mouth daily.  lisinopril 20 MG tablet Commonly known as: ZESTRIL Take 20 mg by mouth daily.   oxyCODONE-acetaminophen 5-325 MG tablet Commonly known as: PERCOCET/ROXICET Take 1 tablet by mouth every 8 (eight) hours as needed.   pantoprazole 40 MG tablet Commonly known as: Protonix Take 1 tablet (40 mg total) by mouth 2 (two) times daily.   pregabalin 100 MG capsule Commonly known as: LYRICA Take 100 mg by mouth 3 (three) times daily.   albuterol (2.5 MG/3ML) 0.083% nebulizer solution Commonly known as: PROVENTIL Inhale 3 mLs into the lungs every 6 (six) hours as needed for wheezing.   ProAir HFA 108 (90 Base) MCG/ACT inhaler Generic drug: albuterol Inhale 2 puffs into the lungs every 6 (six) hours as needed for wheezing or shortness of breath.   traZODone 50 MG tablet Commonly known as: DESYREL Take 50 mg by mouth at bedtime as needed for sleep.        Follow-up Information     Pasty Spillers, MD Follow up in 2 week(s).   Specialty: Gastroenterology Contact  information: [address] [place] [address] [phone]         Caroline Patterson, NP Follow up in 1 week(s).   Specialty: Internal Medicine Why: needs blood work Contact information: [address] [place] [address] [phone]         Mosetta Pigeon, MD Follow up in 2 week(s).   Specialty: Nephrology Contact information: [address] [place] [address] [phone]                Allergies  Allergen Reactions   Tramadol Other (See Comments) and Itching    Reaction:  Gave pt anxiety  "nervous feeling" Makes her nervous. Denied itching, rash, swelling, or difficulty breathing when she took this medicine.  07/31/19 states took 3 weeks ago did not cause itching or making her nervous it just did not help Reaction:  Gave pt anxiety  Makes her nervous "nervous feeling" Makes her nervous. Denied itching, rash, swelling, or difficulty breathing when she took this medicine.  07/31/19 states took 3 weeks ago did not cause itching or making her nervous it just did not help     Consultations: Nephrology , Gi   Procedures/Studies: NM Bowel Img Meckels  Result Date: 05/27/2021 CLINICAL DATA:  Lower GI bleed.  Evaluate for Meckel's diverticulum EXAM: NUCLEAR MEDICINE MECKELS SCAN TECHNIQUE: Following the IV administration of the radiopharmaceutical dynamic imaging was performed centered over the abdomen and pelvis for 1 hour. RADIOPHARMACEUTICALS:  10.44 millicuries technetium 99 and pertechnetate COMPARISON:  None. FINDINGS: No abnormal foci of increased radiotracer uptake identified to suggest ectopic gastric mucosa containing Meckel's diverticulum. Normal physiologic tracer uptake is noted within the stomach. IMPRESSION: No scintigraphic evidence for ectopic gastric mucosa containing Meckel's diverticulum. Electronically Signed   By: Signa Kell M.D.   On: 05/27/2021 16:33   US PELVIC COMPLETE WITH  TRANSVAGINAL  Result Date: 05/20/2021 CLINICAL DATA:  Postmenopausal vaginal bleeding EXAM: TRANSABDOMINAL AND TRANSVAGINAL ULTRASOUND OF PELVIS TECHNIQUE: Both transabdominal and transvaginal ultrasound examinations of the pelvis were performed. Transabdominal technique was performed for global imaging of the pelvis including uterus, ovaries, adnexal regions, and pelvic cul-de-sac. It was necessary to proceed with endovaginal exam following the transabdominal exam to visualize the endometrium and ovaries bilaterally. COMPARISON:  None FINDINGS: Uterus Measurements: 4.5 x 3.0 x 4.1 cm = volume: 29 mL. No fibroids or other mass visualized. Endometrium Thickness: 3 mm. There is small simple appearing free fluid  within the endometrial cavity measuring up to 2 mm Right ovary Not visualized.  No adnexal mass. Left ovary Not visualized, no adnexal mass. Other findings There is moderate simple appearing fluid within the pelvis, nonspecific. IMPRESSION: Moderate simple appearing free fluid within the pelvis. Nonvisualization of the ovaries. Trace simple appearing fluid within the endometrial cavity measuring up to 2 mm. Normal endometrial thickness. Electronically Signed   By: Helyn Numbers M.D.   On: 05/20/2021 20:18      Subjective: No bleeding, no sob, no cp  Discharge Exam: Vitals:   05/28/21 0740 05/28/21 1124  BP: (!) 178/87 (!) 156/80  Pulse: 85 86  Resp: 17 17  Temp: 98 F (36.7 C) 97.8 F (36.6 C)  SpO2: 100% 100%   Vitals:   05/28/21 0029 05/28/21 0411 05/28/21 0740 05/28/21 1124  BP: (!) 153/93 (!) 186/94 (!) 178/87 (!) 156/80  Pulse: 77 81 85 86  Resp: 16 20 17 17   Temp: 97.8 F (36.6 C) 97.6 F (36.4 C) 98 F (36.7 C) 97.8 F (36.6 C)  TempSrc:      SpO2: 100% 100% 100% 100%  Weight:      Height:        General: Pt is alert, awake, not in acute distress Cardiovascular: RRR, S1/S2 +, no rubs, no gallops Respiratory: CTA bilaterally, no wheezing, no rhonchi Abdominal: Soft,  NT, ND, bowel sounds + Extremities: no edema, no cyanosis    The results of significant diagnostics from this hospitalization (including imaging, microbiology, ancillary and laboratory) are listed below for reference.     Microbiology: Recent Results (from the past 240 hour(s))  Resp Panel by RT-PCR (Flu A&B, Covid) Nasopharyngeal Swab     Status: None   Collection Time: 05/20/21  6:17 PM   Specimen: Nasopharyngeal Swab; Nasopharyngeal(NP) swabs in vial transport medium  Result Value Ref Range Status   SARS Coronavirus 2 by RT PCR NEGATIVE NEGATIVE Final    Comment: (NOTE) SARS-CoV-2 target nucleic acids are NOT DETECTED.  The SARS-CoV-2 RNA is generally detectable in upper respiratory specimens during the acute phase of infection. The lowest concentration of SARS-CoV-2 viral copies this assay can detect is 138 copies/mL. A negative result does not preclude SARS-Cov-2 infection and should not be used as the sole basis for treatment or other patient management decisions. A negative result may occur with  improper specimen collection/handling, submission of specimen other than nasopharyngeal swab, presence of viral mutation(s) within the areas targeted by this assay, and inadequate number of viral copies(<138 copies/mL). A negative result must be combined with clinical observations, patient history, and epidemiological information. The expected result is Negative.  Fact Sheet for Patients:  07/20/21  Fact Sheet for Healthcare Providers:  BloggerCourse.com  This test is no t yet approved or cleared by the [place] FDA and  has been authorized for detection and/or diagnosis of SARS-CoV-2 by FDA under an Emergency Use Authorization (EUA). This EUA will remain  in effect (meaning this test can be used) for the duration of the COVID-19 declaration under Section 564(b)(1) of the Act, 21 U.S.C.section 360bbb-3(b)(1),  unless the authorization is terminated  or revoked sooner.       Influenza A by PCR NEGATIVE NEGATIVE Final   Influenza B by PCR NEGATIVE NEGATIVE Final    Comment: (NOTE) The Xpert Xpress SARS-CoV-2/FLU/RSV plus assay is intended as an aid in the diagnosis of influenza from Nasopharyngeal swab specimens and should not be used as a sole basis for treatment. Nasal  washings and aspirates are unacceptable for Xpert Xpress SARS-CoV-2/FLU/RSV testing.  Fact Sheet for Patients: BloggerCourse.com  Fact Sheet for Healthcare Providers: [employer]ious[employer]ker[employer]it  This test is not yet approved or cleared by the [place] FDA and has been authorized for detection and/or diagnosis of SARS-CoV-2 by FDA under an Emergency Use Authorization (EUA). This EUA will remain in effect (meaning this test can be used) for the duration of the COVID-19 declaration under Section 564(b)(1) of the Act, 21 U.S.C. section 360bbb-3(b)(1), unless the authorization is terminated or revoked.  Performed at [facility], [address] [place], [place] [address]      Labs: BNP (last 3 results) No results for input(s): BNP in the last 8760 hours. Basic Metabolic Panel: Recent Labs  Lab 05/24/21 0755 05/25/21 0443 05/26/21 0545 05/28/21 0555  NA 137 138  --   --   K 3.4* 4.3  --   --   CL 104 109  --   --   CO2 25 23  --   --   GLUCOSE 110* 113*  --   --   BUN 11 10  --   --   CREATININE 2.11* 1.93* 2.15* 2.02*  CALCIUM 8.4* 8.4*  --   --    Liver Function Tests: No results for input(s): AST, ALT, ALKPHOS, BILITOT, PROT, ALBUMIN in the last 168 hours. No results for input(s): LIPASE, AMYLASE in the last 168 hours. No results for input(s): AMMONIA in the last 168 hours. CBC: Recent Labs  Lab 05/24/21 0755 05/24/21 1144 05/25/21 0443 05/25/21 1511 05/27/21 0400 05/27/21 1626 05/27/21 2304 05/28/21 0555 05/28/21 1225  WBC 7.5  --  7.1   --   --   --   --   --   --   NEUTROABS 5.2  --  4.6  --   --   --   --   --   --   HGB 9.4*   < > 9.6*   < > 8.4* 9.5* 9.0* 9.0* 9.9*  HCT 28.5*  --  30.3*  --   --   --   --  28.2*  --   MCV 86.9  --  87.1  --   --   --   --   --   --   PLT 202  --  206  --   --   --   --   --   --    < > = values in this interval not displayed.   Cardiac Enzymes: No results for input(s): CKTOTAL, CKMB, CKMBINDEX, TROPONINI in the last 168 hours. BNP: Invalid input(s): POCBNP CBG: No results for input(s): GLUCAP in the last 168 hours. D-Dimer No results for input(s): DDIMER in the last 72 hours. Hgb A1c No results for input(s): HGBA1C in the last 72 hours. Lipid Profile No results for input(s): CHOL, HDL, LDLCALC, TRIG, CHOLHDL, LDLDIRECT in the last 72 hours. Thyroid function studies No results for input(s): TSH, T4TOTAL, T3FREE, THYROIDAB in the last 72 hours.  Invalid input(s): FREET3 Anemia work up No results for input(s): VITAMINB12, FOLATE, FERRITIN, TIBC, IRON, RETICCTPCT in the last 72 hours. Urinalysis    Component Value Date/Time   COLORURINE YELLOW (A) 01/19/2021 1437   APPEARANCEUR HAZY (A) 01/19/2021 1437   LABSPEC 1.012 01/19/2021 1437   PHURINE 5.0 01/19/2021 1437   GLUCOSEU 50 (A) 01/19/2021 1437   HGBUR NEGATIVE 01/19/2021 1437   BILIRUBINUR NEGATIVE 01/19/2021 1437   KETONESUR NEGATIVE 01/19/2021 1437  PROTEINUR 100 (A) 01/19/2021 1437   NITRITE NEGATIVE 01/19/2021 1437   LEUKOCYTESUR SMALL (A) 01/19/2021 1437   Sepsis Labs Invalid input(s): PROCALCITONIN,  WBC,  LACTICIDVEN Microbiology Recent Results (from the past 240 hour(s))  Resp Panel by RT-PCR (Flu A&B, Covid) Nasopharyngeal Swab     Status: None   Collection Time: 05/20/21  6:17 PM   Specimen: Nasopharyngeal Swab; Nasopharyngeal(NP) swabs in vial transport medium  Result Value Ref Range Status   SARS Coronavirus 2 by RT PCR NEGATIVE NEGATIVE Final    Comment: (NOTE) SARS-CoV-2 target nucleic acids are  NOT DETECTED.  The SARS-CoV-2 RNA is generally detectable in upper respiratory specimens during the acute phase of infection. The lowest concentration of SARS-CoV-2 viral copies this assay can detect is 138 copies/mL. A negative result does not preclude SARS-Cov-2 infection and should not be used as the sole basis for treatment or other patient management decisions. A negative result may occur with  improper specimen collection/handling, submission of specimen other than nasopharyngeal swab, presence of viral mutation(s) within the areas targeted by this assay, and inadequate number of viral copies(<138 copies/mL). A negative result must be combined with clinical observations, patient history, and epidemiological information. The expected result is Negative.  Fact Sheet for Patients:  BloggerCourse.com  Fact Sheet for Healthcare Providers:  [employer]ker[employer]  This test is no t yet approved or cleared by the [place] FDA and  has been authorized for detection and/or diagnosis of SARS-CoV-2 by FDA under an Emergency Use Authorization (EUA). This EUA will remain  in effect (meaning this test can be used) for the duration of the COVID-19 declaration under Section 564(b)(1) of the Act, 21 U.S.C.section 360bbb-3(b)(1), unless the authorization is terminated  or revoked sooner.       Influenza A by PCR NEGATIVE NEGATIVE Final   Influenza B by PCR NEGATIVE NEGATIVE Final    Comment: (NOTE) The Xpert Xpress SARS-CoV-2/FLU/RSV plus assay is intended as an aid in the diagnosis of influenza from Nasopharyngeal swab specimens and should not be used as a sole basis for treatment. Nasal washings and aspirates are unacceptable for Xpert Xpress SARS-CoV-2/FLU/RSV testing.  Fact Sheet for Patients: BloggerCourse.com  Fact Sheet for Healthcare Providers: [employer]ious[employer]ker[employer]it  This test is not  yet approved or cleared by the [place] FDA and has been authorized for detection and/or diagnosis of SARS-CoV-2 by FDA under an Emergency Use Authorization (EUA). This EUA will remain in effect (meaning this test can be used) for the duration of the COVID-19 declaration under Section 564(b)(1) of the Act, 21 U.S.C. section 360bbb-3(b)(1), unless the authorization is terminated or revoked.  Performed at [facility], [address], [place], [place] [address]      Time coordinating discharge: Over 30 minutes  SIGNED:   Lynn Ito, MD  [facility] 05/28/2021, 3:55 PM Pager   If 7PM-7AM, please contact night-coverage [email or web address] Password [email or web address]

## 2021-05-28 NOTE — Progress Notes (Signed)
[facility]  ROUNDING NOTE   Subjective:   Patient seen resting in bed, tolerating meals appropriately Denies nausea, vomiting, diarrhea Denies shortness of breath or chest pain   Objective:  Vital signs in last 24 hours:  Temp:  [97.6 F (36.4 C)-98.6 F (37 C)] 97.8 F (36.6 C) (10/15 1124) Pulse Rate:  [77-94] 86 (10/15 1124) Resp:  [13-21] 17 (10/15 1124) BP: (145-186)/(70-100) 156/80 (10/15 1124) SpO2:  [98 %-100 %] 100 % (10/15 1124)  Weight change:  Filed Weights   05/20/21 1526 05/21/21 0241 05/22/21 1208  Weight: 51.7 kg 54.7 kg 54.7 kg    Intake/Output: I/O last 3 completed shifts: In: 1239.9 [P.O.:600; I.V.:501; IV Piggyback:138.9] Out: 1400 [Urine:1400]   Intake/Output this shift:  Total I/O In: 360 [P.O.:360] Out: 250 [Urine:250]  Physical Exam: General: NAD, resting in bed  Head: Normocephalic, atraumatic. Moist oral mucosal membranes  Eyes: Anicteric  Lungs:  Clear to auscultation, normal effort  Heart: Regular rate and rhythm  Abdomen:  Soft, nontender, nondistended, bowel sounds present  Extremities: No peripheral edema.  Neurologic: Nonfocal, moving all four extremities  Skin: No lesions       Basic Metabolic Panel: Recent Labs  Lab 05/24/21 0755 05/25/21 0443 05/26/21 0545 05/28/21 0555  NA 137 138  --   --   K 3.4* 4.3  --   --   CL 104 109  --   --   CO2 25 23  --   --   GLUCOSE 110* 113*  --   --   BUN 11 10  --   --   CREATININE 2.11* 1.93* 2.15* 2.02*  CALCIUM 8.4* 8.4*  --   --     Liver Function Tests: No results for input(s): AST, ALT, ALKPHOS, BILITOT, PROT, ALBUMIN in the last 168 hours. No results for input(s): LIPASE, AMYLASE in the last 168 hours. No results for input(s): AMMONIA in the last 168 hours.  CBC: Recent Labs  Lab 05/24/21 0755 05/24/21 1144 05/25/21 0443 05/25/21 1511 05/27/21 0400 05/27/21 1626 05/27/21 2304 05/28/21 0555 05/28/21 1225  WBC 7.5  --  7.1  --   --   --   --    --   --   NEUTROABS 5.2  --  4.6  --   --   --   --   --   --   HGB 9.4*   < > 9.6*   < > 8.4* 9.5* 9.0* 9.0* 9.9*  HCT 28.5*  --  30.3*  --   --   --   --  28.2*  --   MCV 86.9  --  87.1  --   --   --   --   --   --   PLT 202  --  206  --   --   --   --   --   --    < > = values in this interval not displayed.    Cardiac Enzymes: No results for input(s): CKTOTAL, CKMB, CKMBINDEX, TROPONINI in the last 168 hours.  BNP: Invalid input(s): POCBNP  CBG: No results for input(s): GLUCAP in the last 168 hours.  Microbiology: Results for orders placed or performed during the hospital encounter of 05/20/21  Resp Panel by RT-PCR (Flu A&B, Covid) Nasopharyngeal Swab     Status: None   Collection Time: 05/20/21  6:17 PM   Specimen: Nasopharyngeal Swab; Nasopharyngeal(NP) swabs in vial transport medium  Result Value Ref Range Status  SARS Coronavirus 2 by RT PCR NEGATIVE NEGATIVE Final    Comment: (NOTE) SARS-CoV-2 target nucleic acids are NOT DETECTED.  The SARS-CoV-2 RNA is generally detectable in upper respiratory specimens during the acute phase of infection. The lowest concentration of SARS-CoV-2 viral copies this assay can detect is 138 copies/mL. A negative result does not preclude SARS-Cov-2 infection and should not be used as the sole basis for treatment or other patient management decisions. A negative result may occur with  improper specimen collection/handling, submission of specimen other than nasopharyngeal swab, presence of viral mutation(s) within the areas targeted by this assay, and inadequate number of viral copies(<138 copies/mL). A negative result must be combined with clinical observations, patient history, and epidemiological information. The expected result is Negative.  Fact Sheet for Patients:  BloggerCourse.com  Fact Sheet for Healthcare Providers:  SeriousBroker.it  This test is no t yet approved or  cleared by the [place] FDA and  has been authorized for detection and/or diagnosis of SARS-CoV-2 by FDA under an Emergency Use Authorization (EUA). This EUA will remain  in effect (meaning this test can be used) for the duration of the COVID-19 declaration under Section 564(b)(1) of the Act, 21 U.S.C.section 360bbb-3(b)(1), unless the authorization is terminated  or revoked sooner.       Influenza A by PCR NEGATIVE NEGATIVE Final   Influenza B by PCR NEGATIVE NEGATIVE Final    Comment: (NOTE) The Xpert Xpress SARS-CoV-2/FLU/RSV plus assay is intended as an aid in the diagnosis of influenza from Nasopharyngeal swab specimens and should not be used as a sole basis for treatment. Nasal washings and aspirates are unacceptable for Xpert Xpress SARS-CoV-2/FLU/RSV testing.  Fact Sheet for Patients: BloggerCourse.com  Fact Sheet for Healthcare Providers: SeriousBroker.it  This test is not yet approved or cleared by the [place] FDA and has been authorized for detection and/or diagnosis of SARS-CoV-2 by FDA under an Emergency Use Authorization (EUA). This EUA will remain in effect (meaning this test can be used) for the duration of the COVID-19 declaration under Section 564(b)(1) of the Act, 21 U.S.C. section 360bbb-3(b)(1), unless the authorization is terminated or revoked.  Performed at [facility], [address] [place], [place] [address]     Coagulation Studies: No results for input(s): LABPROT, INR in the last 72 hours.  Urinalysis: No results for input(s): COLORURINE, LABSPEC, PHURINE, GLUCOSEU, HGBUR, BILIRUBINUR, KETONESUR, PROTEINUR, UROBILINOGEN, NITRITE, LEUKOCYTESUR in the last 72 hours.  Invalid input(s): APPERANCEUR    Imaging: NM Bowel Img Meckels  Result Date: 05/27/2021 CLINICAL DATA:  Lower GI bleed.  Evaluate for Meckel's diverticulum EXAM: NUCLEAR MEDICINE MECKELS SCAN TECHNIQUE:  Following the IV administration of the radiopharmaceutical dynamic imaging was performed centered over the abdomen and pelvis for 1 hour. RADIOPHARMACEUTICALS:  10.44 millicuries technetium 99 and pertechnetate COMPARISON:  None. FINDINGS: No abnormal foci of increased radiotracer uptake identified to suggest ectopic gastric mucosa containing Meckel's diverticulum. Normal physiologic tracer uptake is noted within the stomach. IMPRESSION: No scintigraphic evidence for ectopic gastric mucosa containing Meckel's diverticulum. Electronically Signed   By: Signa Kell M.D.   On: 05/27/2021 16:33     Medications:    sodium chloride 10 mL/hr at 05/27/21 1227   cefTRIAXone (ROCEPHIN)  IV 2 g (05/27/21 1732)    amLODipine  10 mg Oral Daily   atorvastatin  80 mg Oral QHS   carvedilol  25 mg Oral BID WC   dicyclomine  20 mg Oral TID AC & HS  DULoxetine  30 mg Oral Daily   hydrALAZINE  50 mg Oral Q8H   pantoprazole (PROTONIX) IV  40 mg Intravenous Q12H   pregabalin  100 mg Oral TID   albuterol, hydrALAZINE, ondansetron (ZOFRAN) IV, oxyCODONE-acetaminophen, sodium chloride flush, traZODone  Assessment/ Plan:  Caroline Smith is a 60 y.o.  female with medical conditions including GERD, anxiety, PAD, cirrhosis with Hep C, Bronchitis and chronic kidney disease stage 3B, who was admitted to [facility] on 05/20/2021 for Acute blood loss anemia [D62] Vaginal bleeding [N93.9] Anemia, unspecified type [D64.9] ABLA (acute blood loss anemia) [D62]   Acute Kidney Injury on chronic kidney disease stage 3b with baseline creatinine 1.88 and GFR of 31 on 05/20/21.  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  Suggest patient follow-up with nephrology at discharge.    2. Anemia of chronic kidney disease  Hgb remained stable Flex Sigmoidoscopy  performed on 05/22/21 noted blood in rectum, sigmoid colon and descending colon.  Colonoscopy completed by GI on 05/25/21, 1 polyp removed, no active bleeding noted.  EGD performed on 05/27/21 which showed small hiatal hernia, nonbleeding diverticulum, and a single angioectasia with bleeding on contact.     LOS: 7   10/15/20221:04 PM

## 2021-05-28 NOTE — Progress Notes (Addendum)
Discharge instructions explained to pt/ verbalized an understanding/ ivs and tele removed/ refused to wait for wheelchair- pt ambulated off unit

## 2021-05-28 NOTE — Progress Notes (Signed)
CSW spoke with patient's daughter who reports she will pick patient up at 3pm today.   CSW has provided RN with taxi voucher should daughter not be able to arrive this afternoon to transport patient home.  No other discharge needs identified.    [place], [place] [phone]

## 2021-05-30 ENCOUNTER — Encounter: Payer: Self-pay | Admitting: Gastroenterology

## 2021-05-31 LAB — OVA + PARASITE EXAM

## 2021-05-31 LAB — O&P RESULT

## 2021-06-02 ENCOUNTER — Other Ambulatory Visit: Payer: Self-pay | Admitting: Gastroenterology

## 2021-06-02 DIAGNOSIS — R195 Other fecal abnormalities: Secondary | ICD-10-CM

## 2022-09-15 IMAGING — CT CT ABD-PELV W/O CM
2 of 4 series · 16 of 46 positions shown, 18 images · non-contrast
Comparison: 12/26/2020

CLINICAL DATA: Abdominal pain with recurrent diarrhea.

EXAM:
CT ABDOMEN AND PELVIS WITHOUT CONTRAST
TECHNIQUE: Multidetector CT imaging of the abdomen and pelvis was performed
following the standard protocol without IV contrast.

[Series 2: routine abd/pel wo · axial · 0.59mm/px · z∈[-509,-109]mm · 13 of 88 slices shown, 15 images]
[im 4/88  soft-tissue]
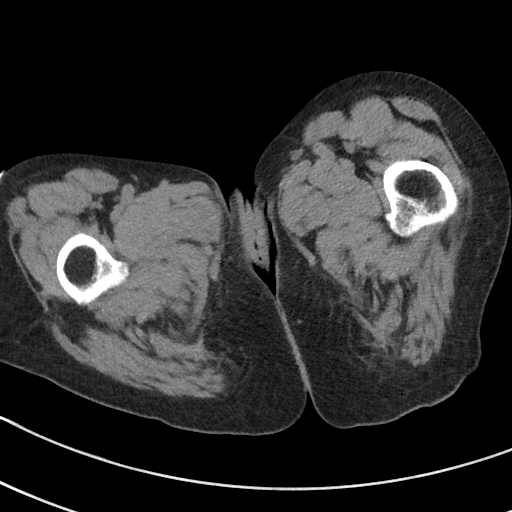
[im 4/88  bone]
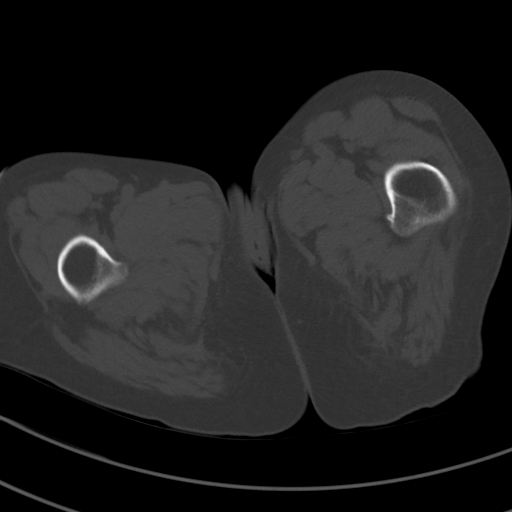
[im 11/88  soft-tissue]
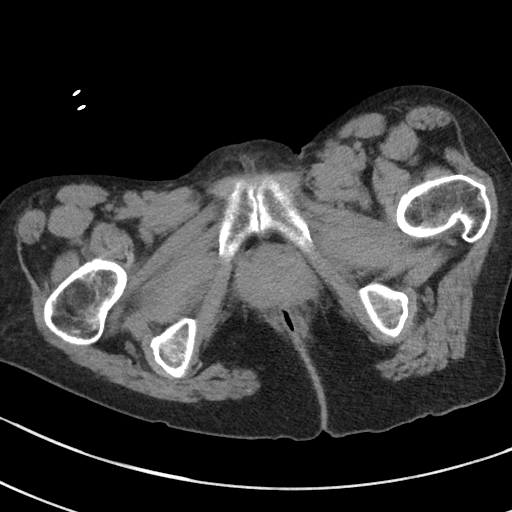
[im 18/88  soft-tissue]
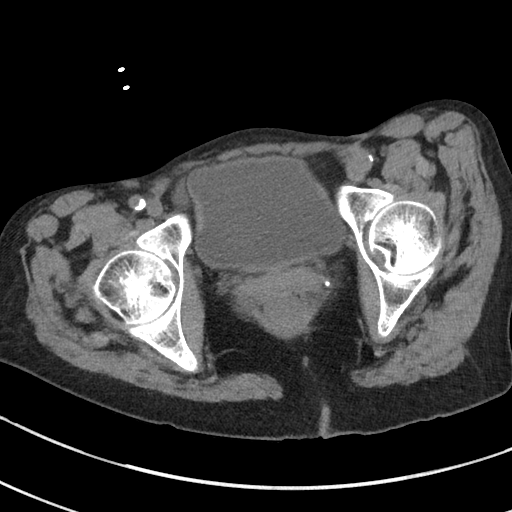
[im 25/88  soft-tissue]
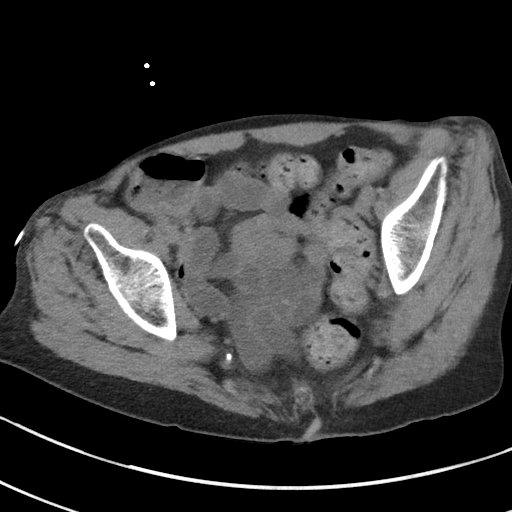
[im 32/88  soft-tissue]
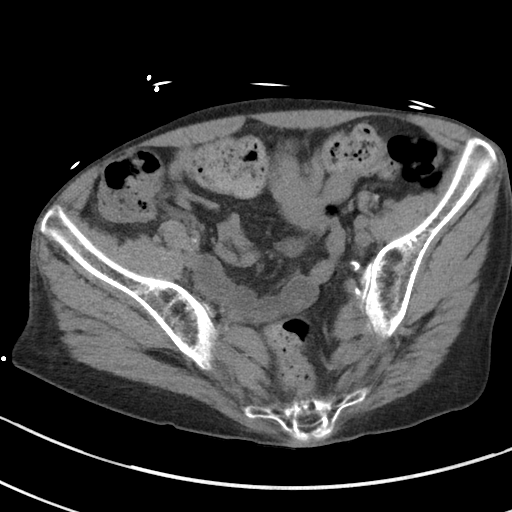
[im 39/88  soft-tissue]
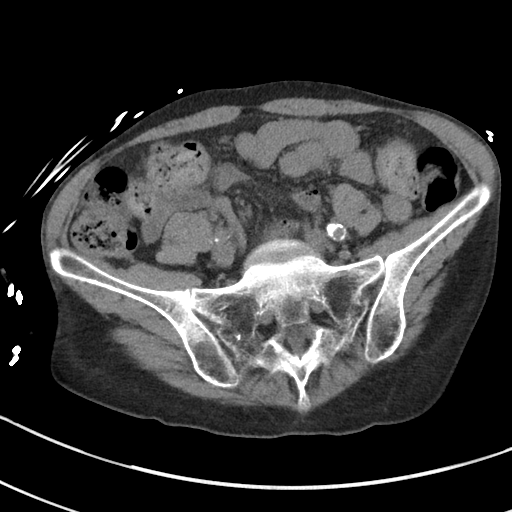
[im 46/88  soft-tissue]
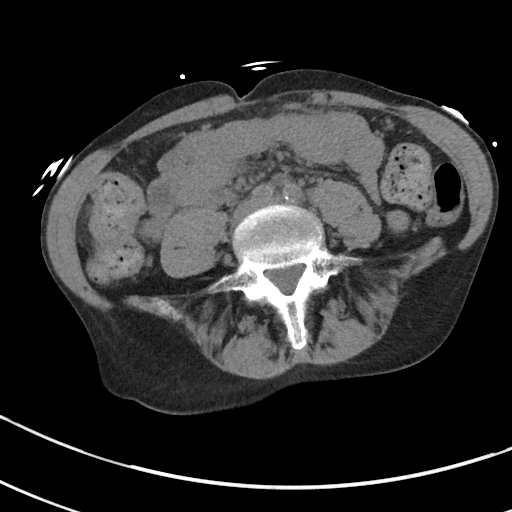
[im 49/88  soft-tissue]
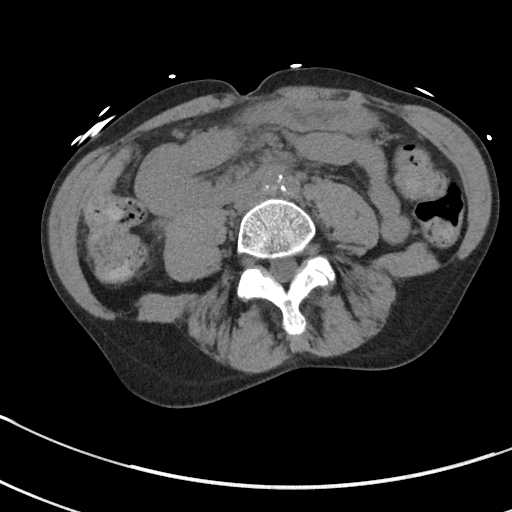
[im 56/88  soft-tissue]
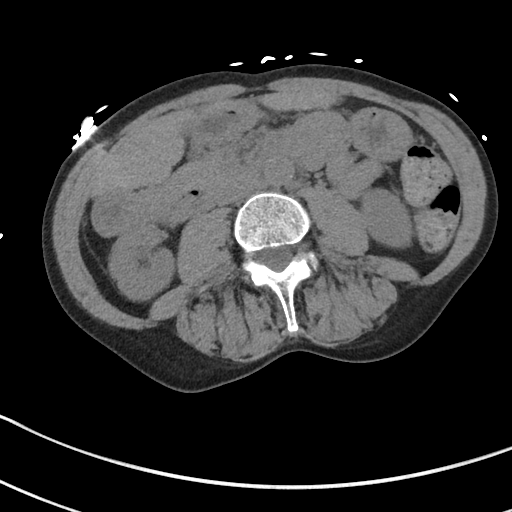
[im 56/88  bone]
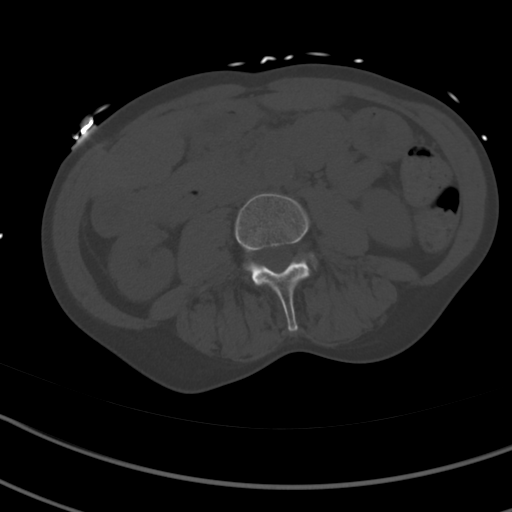
[im 63/88  soft-tissue]
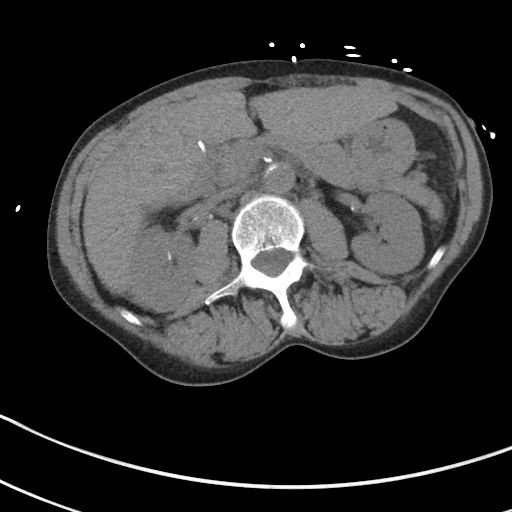
[im 70/88  soft-tissue]
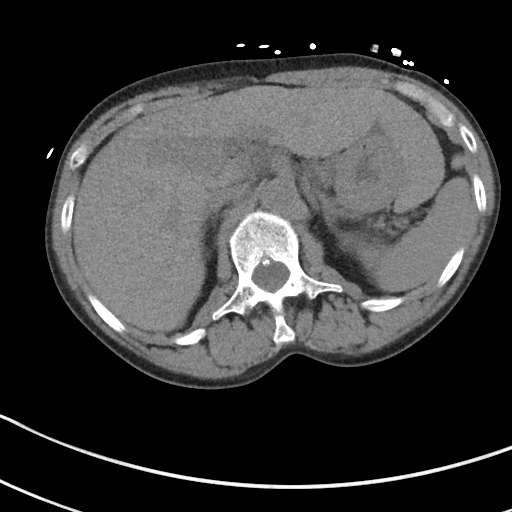
[im 77/88  soft-tissue]
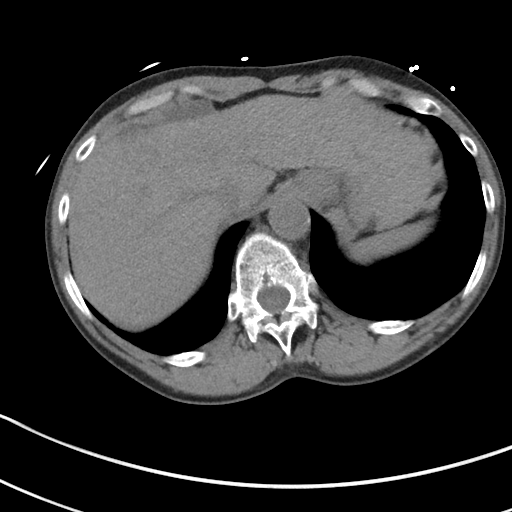
[im 84/88  soft-tissue]
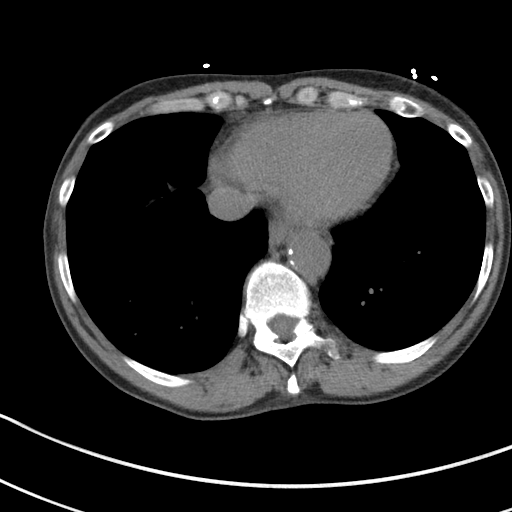

[Series 4: coronal st · coronal · 0.73mm/px · 3 of 71 slices shown]
[im 24/71  soft-tissue]
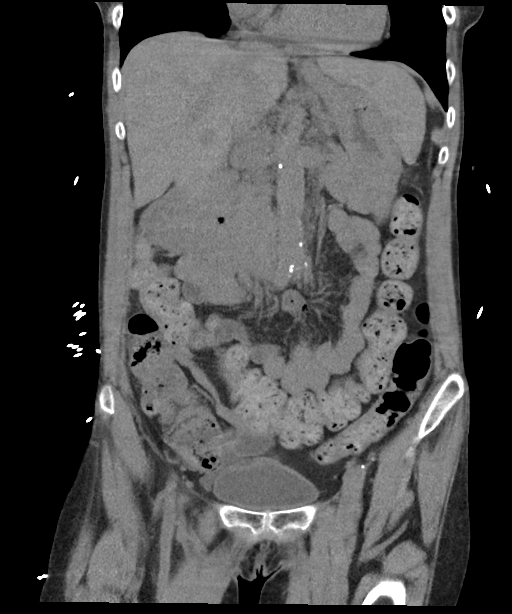
[im 32/71  soft-tissue]
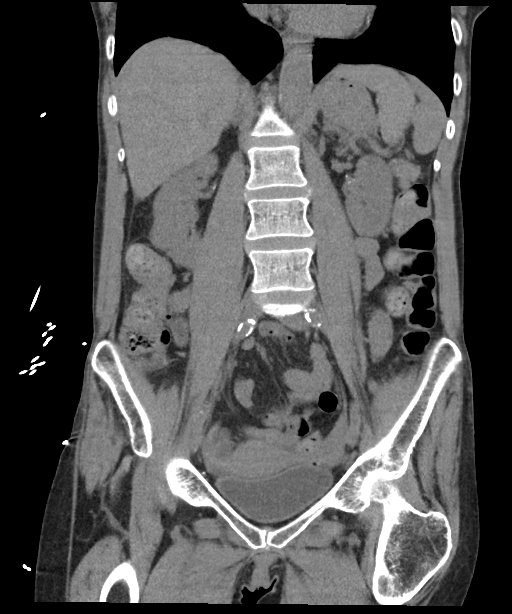
[im 39/71  soft-tissue]
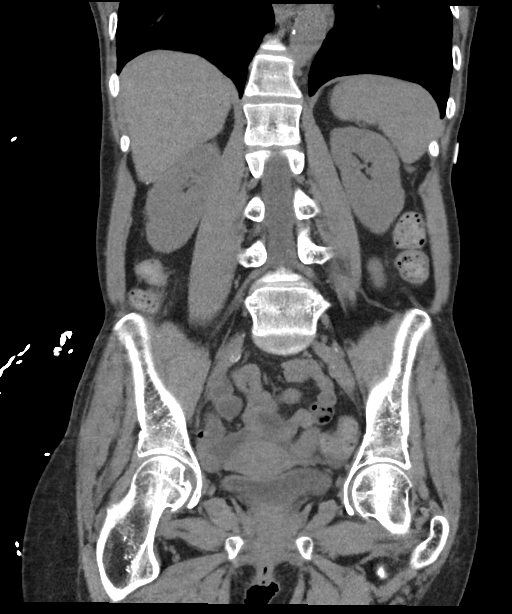

[16 of 46 positions shown; findings below may reference images not displayed]

FINDINGS: Lower chest: Unremarkable

Hepatobiliary: Nodular liver contour is compatible with cirrhosis
(see inferior right liver on 35/2). Gallbladder is surgically
absent. No intrahepatic or extrahepatic biliary dilation.

Pancreas: No focal mass lesion. No dilatation of the main duct. No
intraparenchymal cyst. No peripancreatic edema.

Spleen: No splenomegaly. No focal mass lesion.

Adrenals/Urinary Tract: No adrenal nodule or mass. 2-3 mm
nonobstructing stone identified upper pole right kidney left kidney
unremarkable. No evidence for hydroureter. The urinary bladder
appears normal for the degree of distention.

Stomach/Bowel: Stomach is unremarkable. No gastric wall thickening.
No evidence of outlet obstruction. Duodenum is normally positioned
as is the ligament of Treitz. No small bowel wall thickening. No
small bowel dilatation. The terminal ileum is normal. The appendix
is normal. No gross colonic mass. No colonic wall thickening.

Vascular/Lymphatic: There is abdominal aortic atherosclerosis
without aneurysm. There is no gastrohepatic or hepatoduodenal
ligament lymphadenopathy. No retroperitoneal or mesenteric
lymphadenopathy. No pelvic sidewall lymphadenopathy.

Reproductive: The uterus is unremarkable.  There is no adnexal mass.

Other: Small volume free fluid noted previously in the cul-de-sac
has resolved in the interval.

Musculoskeletal: No worrisome lytic or sclerotic osseous
abnormality.
IMPRESSION: 1. No acute findings in the abdomen or pelvis. Specifically, no
findings to explain the patient's history of abdominal pain and
recurrent diarrhea.
2. Nodular liver contour compatible with cirrhosis.
3. 2-3 mm nonobstructing stone upper pole right kidney.
4. Interval resolution of the trace free fluid seen in the pelvis
previously.
5. Aortic Atherosclerosis ([id number]).

## 2023-01-21 IMAGING — NM NM BOWEL IMG MECKELS
1 series · 6 of 6 positions shown · non-contrast
Comparison: None.

CLINICAL DATA: Lower GI bleed.  Evaluate for Meckel's diverticulum

EXAM:
NUCLEAR MEDICINE MECKELS SCAN
TECHNIQUE: Following the IV administration of the radiopharmaceutical dynamic
imaging was perfor[REDACTED]ed over the abdomen and pelvis for 1
hour.
RADIOPHARMACEUTICALS:  10.44 millicuries technetium 99 and
pertechnetate

[Series 1000: (person_name) scan · 9.59mm/px · 6 of 60 frames shown]
[frame 6/60]
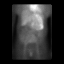
[frame 16/60]
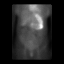
[frame 26/60]
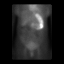
[frame 36/60]
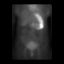
[frame 46/60]
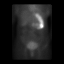
[frame 56/60]
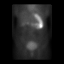

[6 of 6 positions shown; findings below may reference images not displayed]

FINDINGS: No abnormal foci of increased radiotracer uptake identified to
suggest ectopic gastric mucosa containing Meckel's diverticulum.
Normal physiologic tracer uptake is noted within the stomach.
IMPRESSION: No scintigraphic evidence for ectopic gastric mucosa containing
Meckel's diverticulum.
